# Patient Record
Sex: Male | Born: 1965 | Race: White | Hispanic: No | Marital: Married | Smoking: Never smoker
Health system: Southern US, Community
[De-identification: ages and names within clinical notes are randomized; demographics above are authoritative.]

## PROBLEM LIST (undated history)

## (undated) DIAGNOSIS — F319 Bipolar disorder, unspecified: Secondary | ICD-10-CM

## (undated) DIAGNOSIS — N19 Unspecified kidney failure: Secondary | ICD-10-CM

## (undated) DIAGNOSIS — E669 Obesity, unspecified: Secondary | ICD-10-CM

## (undated) DIAGNOSIS — F32A Depression, unspecified: Secondary | ICD-10-CM

## (undated) DIAGNOSIS — T8859XA Other complications of anesthesia, initial encounter: Secondary | ICD-10-CM

## (undated) DIAGNOSIS — F4310 Post-traumatic stress disorder, unspecified: Secondary | ICD-10-CM

## (undated) DIAGNOSIS — K635 Polyp of colon: Secondary | ICD-10-CM

## (undated) DIAGNOSIS — R079 Chest pain, unspecified: Secondary | ICD-10-CM

## (undated) DIAGNOSIS — G4733 Obstructive sleep apnea (adult) (pediatric): Secondary | ICD-10-CM

## (undated) HISTORY — PX: WRIST SURGERY: SHX841

## (undated) HISTORY — DX: Bipolar disorder, unspecified: F31.9

## (undated) HISTORY — DX: Post-traumatic stress disorder, unspecified: F43.10

## (undated) HISTORY — DX: Obesity, unspecified: E66.9

## (undated) HISTORY — DX: Polyp of colon: K63.5

## (undated) HISTORY — DX: Chest pain, unspecified: R07.9

## (undated) HISTORY — DX: Obstructive sleep apnea (adult) (pediatric): G47.33

## (undated) HISTORY — DX: Depression, unspecified: F32.A

---
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
## 2002-12-07 ENCOUNTER — Encounter: Payer: Self-pay | Admitting: Pulmonary Disease

## 2002-12-14 ENCOUNTER — Encounter: Payer: Self-pay | Admitting: Pulmonary Disease

## 2003-01-02 ENCOUNTER — Encounter: Payer: Self-pay | Admitting: Pulmonary Disease

## 2009-10-26 ENCOUNTER — Ambulatory Visit: Payer: Self-pay | Admitting: Pulmonary Disease

## 2009-10-26 DIAGNOSIS — G4733 Obstructive sleep apnea (adult) (pediatric): Secondary | ICD-10-CM

## 2009-10-26 HISTORY — DX: Obstructive sleep apnea (adult) (pediatric): G47.33

## 2009-11-01 ENCOUNTER — Encounter: Payer: Self-pay | Admitting: Pulmonary Disease

## 2009-11-01 ENCOUNTER — Ambulatory Visit (HOSPITAL_BASED_OUTPATIENT_CLINIC_OR_DEPARTMENT_OTHER): Admission: RE | Admit: 2009-11-01 | Discharge: 2009-11-01 | Payer: Self-pay | Admitting: Family Medicine

## 2009-11-16 ENCOUNTER — Ambulatory Visit: Payer: Self-pay | Admitting: Pulmonary Disease

## 2009-11-22 ENCOUNTER — Ambulatory Visit: Payer: Self-pay | Admitting: Pulmonary Disease

## 2010-01-03 ENCOUNTER — Telehealth (INDEPENDENT_AMBULATORY_CARE_PROVIDER_SITE_OTHER): Payer: Self-pay | Admitting: *Deleted

## 2010-04-17 ENCOUNTER — Encounter: Payer: Self-pay | Admitting: Pulmonary Disease

## 2010-09-24 NOTE — Letter (Signed)
Summary: [facility]  [facility]   Imported By: Lester Palmer 11/05/2009 10:32:30  _____________________________________________________________________  External Attachment:    Type:   Image     Comment:   External Document

## 2010-09-24 NOTE — Miscellaneous (Signed)
  Clinical Lists Changes  received note from dme the pt decided to stop cpap, and just work on weight loss. He has mild to moderate disease with only mild increased CV risk. will have nurse call pt to please call if he is not having success with weight loss.  Appended Document:  megan, please see this note.  Please call pt and let him know to call us back if he isn't making progress with weight loss.  We can discuss cpap, and other options for treatment.  Appended Document:  [id number]  Appended Document:  called and spoke wiht pt.  pt is aware to call our office to discuss other treatment options if he isn't sucessful with weight loss.

## 2010-09-24 NOTE — Progress Notes (Signed)
Summary: cpap  Phone Note Call from Patient   Caller: mindy-wife Call For: clance Summary of Call: [email or web address] wants [facility] to increase cpap pressure on husbands cpap machine Initial call taken by: Oneita Jolly,  Jan 03, 2010 8:57 AM  Follow-up for Phone Call        please get dme to put on auto mode for next 2 weeks to optimize his pressure for him, and let pt know that we will call him when data available. Follow-up by: Barbaraann Share MD,  Jan 03, 2010 4:34 PM  Additional Follow-up for Phone Call Additional follow up Details #1::        Aundra Millet will you put me an order in pcc box to put this pt on auto thanks Additional Follow-up by: Oneita Jolly,  Jan 04, 2010 9:40 AM    Additional Follow-up for Phone Call Additional follow up Details #2::    order in EMR.  Arman Filter LPN  Jan 04, 2010 10:33 AM

## 2010-09-24 NOTE — Assessment & Plan Note (Signed)
Summary: consult for osa.   Copy to:  Aleatha Borer Primary Provider/Referring Provider:  Aleatha Borer  CC:  Sleep Consult.  History of Present Illness: The pt is a 45y/o male who is referred today for possible osa.  He has been noted by his wife to have loud snoring, as well as pauses in his breathing during sleep.  His wife is a respiratory therapist, and has put a pulse oximeter on him during sleep which showed significant oxygen desaturation according to the patient.  He typically goes to bed btw 9-10pm, and arises at 4-6am to start his day.  He feels fairly rested upon arising, but does admit to mild sleep pressure at times during the day that he feels doesn't bother him.  He also notes some dozing in the evening watching tv.  He denies any issues with driving.  His weight is up 50 pounds over the last few years, and his epworth today is 9  Preventive Screening-Counseling & Management  Alcohol-Tobacco     Smoking Status: never  Current Medications (verified): 1)  Synthroid 88 Mcg Tabs (Levothyroxine Sodium) .... Take 1 Tablet By Mouth Once A Day 2)  Lithium Carbonate 300 Mg Caps (Lithium Carbonate) .... Take 3 Tabs By Mouth Daily 3)  Ritalin 20 Mg Tabs (Methylphenidate Hcl) .... Take 3 Tabs By Mouth Daily 4)  Bupropion Hcl 200 Mg Xr12h-Tab (Bupropion Hcl) .... Take 1 Tablet By Mouth Two Times A Day  Allergies (verified): No Known Drug Allergies  Past History:  Past Medical History: bipolar disease  Past Surgical History: none per pt report.   Family History: Reviewed history and no changes required. none per pt report.   Social History: Reviewed history and no changes required. Patient never smoked.  pt is married. pt has children. pt is a superintendant.Smoking Status:  never  Review of Systems       The patient complains of productive cough, weight change, and depression.  The patient denies shortness of breath with activity, shortness of breath at rest,  non-productive cough, coughing up blood, chest pain, irregular heartbeats, acid heartburn, indigestion, loss of appetite, abdominal pain, difficulty swallowing, sore throat, tooth/dental problems, headaches, nasal congestion/difficulty breathing through nose, sneezing, itching, ear ache, anxiety, hand/feet swelling, joint stiffness or pain, rash, change in color of mucus, and fever.    Vital Signs:  Patient profile:   45 year old male Height:      67 inches Weight:      247 pounds BMI:     38.83 O2 Sat:      96 % on Room air Temp:     98.3 degrees F oral Pulse rate:   94 / minute BP sitting:   188 / 80  (left arm) Cuff size:   regular  Vitals Entered By: Arman Filter LPN (October 26, 452 11:40 AM)  O2 Flow:  Room air CC: Sleep Consult Comments Medications reviewed with patient Arman Filter LPN  October 27, 979 11:40 AM    Physical Exam  General:  overweight male in nad  Eyes:  PERRLA and EOMI.   Nose:  mild septal deviation to left. Mouth:  moderate elongation of soft palate, normal uvula Neck:  no jvd, tmg, LN Lungs:  clear to auscultation Heart:  rrr, no mrg Abdomen:  soft and nontender, bs+ Extremities:  no edema noted, pulses intact distally. Neurologic:  alert and oriented, moves all 4.   Impression & Recommendations:  Problem # 1:  OBSTRUCTIVE SLEEP APNEA (  ICD-327.23) the pt's history is very suggestive of osa.  He has gained a considerable amount of weight, his wife has noted an abnormal breathing pattern during sleep, he has documented nocturnal desaturation, and he is clearly sleepier than what he believes.  I have had a long discussion with him about sleep apnea, including its impact on his QOL and CV health.  I think he needs a sleep study for diagnosis, and the pt is agreeable.  Medications Added to Medication List This Visit: 1)  Ritalin 20 Mg Tabs (Methylphenidate hcl) .... Take 3 tabs by mouth daily 2)  Bupropion Hcl 200 Mg Xr12h-tab (Bupropion hcl) ....  Take 1 tablet by mouth two times a day  Other Orders: Consultation Level IV ([address]) Sleep Disorder Referral (Sleep Disorder)  Patient Instructions: 1)  will schedule for sleep study 2)  work on weight loss 3)  will arrange for f/u once results are available.  Appended Document: consult for osa. received sleep study from [facility].  Put in your very important look at folder.

## 2010-09-24 NOTE — Letter (Signed)
Summary: [facility]  [facility]   Imported By: Lester [place] 11/05/2009 10:30:12  _____________________________________________________________________  External Attachment:    Type:   Image     Comment:   External Document

## 2010-09-24 NOTE — Assessment & Plan Note (Signed)
Summary: rov to discuss sleep study results.   Copy to:  Aleatha Borer Primary Provider/Referring Provider:  Aleatha Borer  CC:  Follow up to discuss sleep study results.  No complaints..  History of Present Illness: the pt comes in today for f/u of his recent sleep study.  He was found to have moderate osa with AHI 20/hr and desat to 76%.  I have gone over his study with him in detail, and answered all of his questions.  Current Medications (verified): 1)  Synthroid 88 Mcg Tabs (Levothyroxine Sodium) .... Take 1 Tablet By Mouth Once A Day 2)  Lithium Carbonate 300 Mg Caps (Lithium Carbonate) .... Take 3 Tabs By Mouth Daily 3)  Ritalin 20 Mg Tabs (Methylphenidate Hcl) .... Take 3 Tabs By Mouth Daily 4)  Bupropion Hcl 200 Mg Xr12h-Tab (Bupropion Hcl) .... Take 1 Tablet By Mouth Two Times A Day  Allergies (verified): No Known Drug Allergies  Review of Systems      See HPI  Vital Signs:  Patient profile:   45 year old male Height:      67 inches Weight:      249.50 pounds BMI:     39.22 O2 Sat:      97 % on Room air Temp:     98.0 degrees F oral Pulse rate:   86 / minute BP sitting:   114 / 84  (right arm) Cuff size:   large  Vitals Entered By: Gweneth Dimitri RN (November 16, 2009 9:05 AM)  O2 Flow:  Room air CC: Follow up to discuss sleep study results.  No complaints. Comments Medications reviewed with patient Daytime contact number verified with patient. 35    Physical Exam  General:  ow male in nad   Impression & Recommendations:  Problem # 1:  OBSTRUCTIVE SLEEP APNEA (ICD-327.23) the pt has moderate osa by his recent sleep study, and does have some sleep disruption and daytime symptoms.  I have discussed with him the various treatment options, including a trial of weight loss, surgery, dental appliance, and cpap.  After a long discussion, he has decided to try cpap.  Will start on a moderate pressure level to allow for desensitization, and will later optimize his  pressure.  I have encouraged him to work on weight loss.  Time spent with pt today discussing the above was .  Other Orders: Est. Patient Level III (78469) DME Referral (DME)  Patient Instructions: 1)  will start on cpap.  Please call if tolerance issues 2)  work on weight loss 3)  followup with me in 4weeks.

## 2011-05-20 ENCOUNTER — Other Ambulatory Visit (HOSPITAL_COMMUNITY): Payer: Self-pay | Admitting: Family Medicine

## 2011-05-20 DIAGNOSIS — R079 Chest pain, unspecified: Secondary | ICD-10-CM

## 2011-05-22 ENCOUNTER — Encounter (HOSPITAL_COMMUNITY): Payer: Self-pay | Admitting: Radiology

## 2011-05-29 ENCOUNTER — Ambulatory Visit (HOSPITAL_COMMUNITY): Payer: BC Managed Care – PPO | Attending: Family Medicine | Admitting: Radiology

## 2011-05-29 VITALS — Ht 67.0 in | Wt 244.0 lb

## 2011-05-29 DIAGNOSIS — R079 Chest pain, unspecified: Secondary | ICD-10-CM

## 2011-05-29 DIAGNOSIS — R0989 Other specified symptoms and signs involving the circulatory and respiratory systems: Secondary | ICD-10-CM

## 2011-05-29 DIAGNOSIS — R0609 Other forms of dyspnea: Secondary | ICD-10-CM

## 2011-05-29 DIAGNOSIS — R0602 Shortness of breath: Secondary | ICD-10-CM

## 2011-05-29 MED ORDER — TECHNETIUM TC 99M TETROFOSMIN IV KIT
11.0000 | PACK | Freq: Once | INTRAVENOUS | Status: AC | PRN
  Administered 2011-05-29: 11 via INTRAVENOUS

## 2011-05-29 MED ORDER — TECHNETIUM TC 99M TETROFOSMIN IV KIT
33.0000 | PACK | Freq: Once | INTRAVENOUS | Status: AC | PRN
  Administered 2011-05-29: 33 via INTRAVENOUS

## 2011-05-29 NOTE — Progress Notes (Signed)
[facility] SITE 3 NUCLEAR MED [address] [place] [address] [phone]  Cardiology Nuclear Med Study  Casey Yoder is a 45 y.o. male [id number] 09/17/1965   Nuclear Med Background Indication for Stress Test:  Evaluation for Ischemia History:  No previous documented CAD Cardiac Risk Factors: Family History - CAD  Symptoms:  Chest Pain, DOE and SOB   Nuclear Pre-Procedure Caffeine/Decaff Intake:  7:30am NPO After: 7:30am   Lungs:  clear IV 0.9% NS with Angio Cath:  20g  IV Site: R Antecubital  IV Started by:  Stanton Kidney, EMT-P  Chest Size (in):  48 Cup Size: n/a  Height: 5\' 7"  (1.702 m)  Weight:  244 lb (110.678 kg)  BMI:  Body mass index is 38.22 kg/(m^2). Tech Comments:  NA    Nuclear Med Study 1 or 2 day study: 1 day  Stress Test Type:  Stress  Reading MD: Olga Millers, MD  Order Authorizing Provider:  J.Manning  Resting Radionuclide: Technetium 3m Tetrofosmin  Resting Radionuclide Dose: 11 mCi   Stress Radionuclide:  Technetium 51m Tetrofosmin  Stress Radionuclide Dose: 33 mCi           Stress Protocol Rest HR: 52 Stress HR: 151  Rest BP: 110/74 Stress BP: 144/65  Exercise Time (min): 9:15 METS: 10.50   Predicted Max HR: 176 bpm % Max HR: 85.8 bpm Rate Pressure Product: 21308   Dose of Adenosine (mg):  n/a Dose of Lexiscan: n/a mg  Dose of Atropine (mg): n/a Dose of Dobutamine: n/a mcg/kg/min (at max HR)  Stress Test Technologist: Milana Na, EMT-P  Nuclear Technologist:  Domenic Polite, CNMT     Rest Procedure:  Myocardial perfusion imaging was performed at rest 45 minutes following the intravenous administration of Technetium 73m Tetrofosmin. Rest ECG: Sinus Bradycardia  Stress Procedure:  The patient exercised for 9:15.  The patient stopped due to fatigue and denied any chest pain.  There were no significant ST-T wave changes and rare pvc. BP's fairly Blunted throughout. He did experience some (R) arm and hand  numbness.   Technetium 67m Tetrofosmin was injected at peak exercise and myocardial perfusion imaging was performed after a brief delay. Stress ECG: No significant ST segment change suggestive of ischemia.  QPS Raw Data Images:  Acquisition technically good; normal left ventricular size. Stress Images:  Normal homogeneous uptake in all areas of the myocardium. Rest Images:  Normal homogeneous uptake in all areas of the myocardium. Subtraction (SDS):  No evidence of ischemia. Transient Ischemic Dilatation (Normal <1.22):  .90 Lung/Heart Ratio (Normal <0.45):  .39  Quantitative Gated Spect Images QGS EDV:  102 ml QGS ESV:  38 ml QGS cine images:  NL LV Function; NL Wall Motion QGS EF: 63%  Impression Exercise Capacity:  Good exercise capacity. BP Response:  Normal blood pressure response. Clinical Symptoms:  No chest pain. ECG Impression:  No significant ST segment change suggestive of ischemia. Comparison with Prior Nuclear Study: No images to compare  Overall Impression:  Normal stress nuclear study with no ischemia or infarction.  Olga Millers

## 2011-05-30 ENCOUNTER — Encounter (HOSPITAL_COMMUNITY): Payer: Self-pay | Admitting: Psychiatry

## 2011-05-30 NOTE — Progress Notes (Signed)
Copy of Nuclear Report Faxed to Dr. Kathrynn Running @ [phone].[place]

## 2012-12-24 ENCOUNTER — Encounter: Payer: Self-pay | Admitting: *Deleted

## 2012-12-24 DIAGNOSIS — E669 Obesity, unspecified: Secondary | ICD-10-CM | POA: Insufficient documentation

## 2012-12-24 DIAGNOSIS — F319 Bipolar disorder, unspecified: Secondary | ICD-10-CM | POA: Insufficient documentation

## 2012-12-24 DIAGNOSIS — R079 Chest pain, unspecified: Secondary | ICD-10-CM | POA: Insufficient documentation

## 2012-12-30 ENCOUNTER — Institutional Professional Consult (permissible substitution): Payer: BC Managed Care – PPO | Admitting: Cardiovascular Disease

## 2012-12-30 ENCOUNTER — Encounter: Payer: Self-pay | Admitting: *Deleted

## 2017-02-12 ENCOUNTER — Inpatient Hospital Stay (HOSPITAL_COMMUNITY)
Admission: EM | Admit: 2017-02-12 | Discharge: 2017-02-14 | DRG: 867 | Disposition: A | Discharge status: Home / Self Care | Payer: BC Managed Care – PPO | Attending: Internal Medicine | Admitting: Internal Medicine

## 2017-02-12 ENCOUNTER — Emergency Department (HOSPITAL_COMMUNITY): Payer: BC Managed Care – PPO

## 2017-02-12 DIAGNOSIS — G9341 Metabolic encephalopathy: Secondary | ICD-10-CM | POA: Diagnosis present

## 2017-02-12 DIAGNOSIS — G4733 Obstructive sleep apnea (adult) (pediatric): Secondary | ICD-10-CM | POA: Diagnosis present

## 2017-02-12 DIAGNOSIS — E669 Obesity, unspecified: Secondary | ICD-10-CM | POA: Diagnosis present

## 2017-02-12 DIAGNOSIS — Z6835 Body mass index (BMI) 35.0-35.9, adult: Secondary | ICD-10-CM

## 2017-02-12 DIAGNOSIS — B999 Unspecified infectious disease: Secondary | ICD-10-CM | POA: Diagnosis present

## 2017-02-12 DIAGNOSIS — Z6837 Body mass index (BMI) 37.0-37.9, adult: Secondary | ICD-10-CM

## 2017-02-12 DIAGNOSIS — G9340 Encephalopathy, unspecified: Secondary | ICD-10-CM | POA: Diagnosis not present

## 2017-02-12 DIAGNOSIS — Z8249 Family history of ischemic heart disease and other diseases of the circulatory system: Secondary | ICD-10-CM | POA: Diagnosis not present

## 2017-02-12 DIAGNOSIS — W57XXXA Bitten or stung by nonvenomous insect and other nonvenomous arthropods, initial encounter: Secondary | ICD-10-CM | POA: Diagnosis present

## 2017-02-12 DIAGNOSIS — H53143 Visual discomfort, bilateral: Secondary | ICD-10-CM | POA: Diagnosis present

## 2017-02-12 DIAGNOSIS — R350 Frequency of micturition: Secondary | ICD-10-CM | POA: Diagnosis present

## 2017-02-12 DIAGNOSIS — R358 Other polyuria: Secondary | ICD-10-CM

## 2017-02-12 DIAGNOSIS — R4182 Altered mental status, unspecified: Secondary | ICD-10-CM

## 2017-02-12 DIAGNOSIS — R509 Fever, unspecified: Secondary | ICD-10-CM | POA: Diagnosis not present

## 2017-02-12 DIAGNOSIS — R410 Disorientation, unspecified: Secondary | ICD-10-CM

## 2017-02-12 DIAGNOSIS — R3589 Other polyuria: Secondary | ICD-10-CM | POA: Diagnosis present

## 2017-02-12 DIAGNOSIS — I951 Orthostatic hypotension: Secondary | ICD-10-CM | POA: Diagnosis present

## 2017-02-12 DIAGNOSIS — I493 Ventricular premature depolarization: Secondary | ICD-10-CM | POA: Diagnosis present

## 2017-02-12 DIAGNOSIS — F319 Bipolar disorder, unspecified: Secondary | ICD-10-CM | POA: Diagnosis present

## 2017-02-12 LAB — COMPREHENSIVE METABOLIC PANEL
ALT: 15 U/L — ABNORMAL LOW (ref 17–63)
ANION GAP: 9 (ref 5–15)
AST: 30 U/L (ref 15–41)
Albumin: 4.1 g/dL (ref 3.5–5.0)
Alkaline Phosphatase: 62 U/L (ref 38–126)
BUN: 13 mg/dL (ref 6–20)
CHLORIDE: 102 mmol/L (ref 101–111)
CO2: 24 mmol/L (ref 22–32)
Calcium: 9.1 mg/dL (ref 8.9–10.3)
Creatinine, Ser: 1.18 mg/dL (ref 0.61–1.24)
GFR calc non Af Amer: >60 mL/min (ref >60)
Glucose, Bld: 96 mg/dL (ref 65–99)
POTASSIUM: 4.6 mmol/L (ref 3.5–5.1)
Sodium: 135 mmol/L (ref 135–145)
Total Bilirubin: 1.3 mg/dL — ABNORMAL HIGH (ref 0.3–1.2)
Total Protein: 6.4 g/dL — ABNORMAL LOW (ref 6.5–8.1)

## 2017-02-12 LAB — CBC WITH DIFFERENTIAL/PLATELET
BASOS ABS: 0 10*3/uL (ref 0.0–0.1)
Basophils Relative: 0 %
EOS ABS: 0.1 10*3/uL (ref 0.0–0.7)
EOS PCT: 1 %
HCT: 43.8 % (ref 39.0–52.0)
Hemoglobin: 14.9 g/dL (ref 13.0–17.0)
LYMPHS ABS: 1.3 10*3/uL (ref 0.7–4.0)
Lymphocytes Relative: 14 %
MCH: 31.7 pg (ref 26.0–34.0)
MCHC: 34 g/dL (ref 30.0–36.0)
MCV: 93.2 fL (ref 78.0–100.0)
Monocytes Absolute: 0.6 10*3/uL (ref 0.1–1.0)
Monocytes Relative: 6 %
Neutro Abs: 7.4 10*3/uL (ref 1.7–7.7)
Neutrophils Relative %: 79 %
PLATELETS: 187 10*3/uL (ref 150–400)
RBC: 4.7 MIL/uL (ref 4.22–5.81)
RDW: 12.3 % (ref 11.5–15.5)
WBC: 9.3 10*3/uL (ref 4.0–10.5)

## 2017-02-12 LAB — RAPID HIV SCREEN (HIV 1/2 AB+AG)
HIV 1/2 Antibodies: NONREACTIVE
HIV-1 P24 Antigen - HIV24: NONREACTIVE

## 2017-02-12 LAB — BASIC METABOLIC PANEL
Anion gap: 6 (ref 5–15)
BUN: 13 mg/dL (ref 6–20)
CALCIUM: 9.1 mg/dL (ref 8.9–10.3)
CHLORIDE: 107 mmol/L (ref 101–111)
CO2: 25 mmol/L (ref 22–32)
CREATININE: 1.14 mg/dL (ref 0.61–1.24)
GFR calc Af Amer: >60 mL/min (ref >60)
GFR calc non Af Amer: >60 mL/min (ref >60)
GLUCOSE: 95 mg/dL (ref 65–99)
Potassium: 4 mmol/L (ref 3.5–5.1)
Sodium: 138 mmol/L (ref 135–145)

## 2017-02-12 LAB — CSF CELL COUNT WITH DIFFERENTIAL
RBC COUNT CSF: 391 /mm3 — AB
TUBE #: 1
WBC, CSF: 2 /mm3 (ref 0–5)

## 2017-02-12 LAB — RAPID URINE DRUG SCREEN, HOSP PERFORMED
AMPHETAMINES: NOT DETECTED
Barbiturates: NOT DETECTED
Benzodiazepines: NOT DETECTED
Cocaine: NOT DETECTED
OPIATES: NOT DETECTED
Tetrahydrocannabinol: NOT DETECTED

## 2017-02-12 LAB — LACTIC ACID, PLASMA
LACTIC ACID, VENOUS: 0.7 mmol/L (ref 0.5–1.9)
Lactic Acid, Venous: 0.9 mmol/L (ref 0.5–1.9)

## 2017-02-12 LAB — URINALYSIS, ROUTINE W REFLEX MICROSCOPIC
BILIRUBIN URINE: NEGATIVE
Glucose, UA: NEGATIVE mg/dL
Hgb urine dipstick: NEGATIVE
Ketones, ur: NEGATIVE mg/dL
Leukocytes, UA: NEGATIVE
NITRITE: NEGATIVE
PH: 7 (ref 5.0–8.0)
Protein, ur: NEGATIVE mg/dL
SPECIFIC GRAVITY, URINE: 1.002 — AB (ref 1.005–1.030)

## 2017-02-12 LAB — TSH: TSH: 3.542 u[IU]/mL (ref 0.350–4.500)

## 2017-02-12 LAB — AMMONIA: AMMONIA: 26 umol/L (ref 9–35)

## 2017-02-12 LAB — I-STAT CG4 LACTIC ACID, ED
Lactic Acid, Venous: 1.27 mmol/L (ref 0.5–1.9)
Lactic Acid, Venous: 0.81 mmol/L (ref 0.5–1.9)

## 2017-02-12 LAB — ETHANOL

## 2017-02-12 LAB — CK: CK TOTAL: 111 U/L (ref 49–397)

## 2017-02-12 LAB — CRYPTOCOCCAL ANTIGEN, CSF: CRYPTO AG: NEGATIVE

## 2017-02-12 LAB — GLUCOSE, CSF: Glucose, CSF: 58 mg/dL (ref 40–70)

## 2017-02-12 LAB — I-STAT TROPONIN, ED: TROPONIN I, POC: 0 ng/mL (ref 0.00–0.08)

## 2017-02-12 LAB — TROPONIN I

## 2017-02-12 LAB — PROTEIN, CSF: TOTAL PROTEIN, CSF: 34 mg/dL (ref 15–45)

## 2017-02-12 MED ORDER — ONDANSETRON HCL 4 MG PO TABS: 4.0000 mg | ORAL_TABLET | Freq: Four times a day (QID) | ORAL | Status: DC | PRN

## 2017-02-12 MED ORDER — DIPHENHYDRAMINE HCL 50 MG/ML IJ SOLN
25.0000 mg | Freq: Once | INTRAMUSCULAR | Status: AC
  Administered 2017-02-12: 25 mg via INTRAVENOUS
  Filled 2017-02-12: qty 1

## 2017-02-12 MED ORDER — LORAZEPAM 2 MG/ML IJ SOLN
1.0000 mg | Freq: Once | INTRAMUSCULAR | Status: AC
  Administered 2017-02-12: 1 mg via INTRAVENOUS
  Filled 2017-02-12: qty 1

## 2017-02-12 MED ORDER — ONDANSETRON HCL 4 MG/2ML IJ SOLN: 4.0000 mg | Freq: Four times a day (QID) | INTRAMUSCULAR | Status: DC | PRN

## 2017-02-12 MED ORDER — MIDAZOLAM HCL 2 MG/2ML IJ SOLN
1.0000 mg | Freq: Once | INTRAMUSCULAR | Status: AC
  Administered 2017-02-12: 1 mg via INTRAVENOUS

## 2017-02-12 MED ORDER — ACETAMINOPHEN 650 MG RE SUPP: 650.0000 mg | Freq: Four times a day (QID) | RECTAL | Status: DC | PRN

## 2017-02-12 MED ORDER — SODIUM CHLORIDE 0.9 % IV BOLUS (SEPSIS)
1000.0000 mL | Freq: Once | INTRAVENOUS | Status: AC
  Administered 2017-02-12: 1000 mL via INTRAVENOUS

## 2017-02-12 MED ORDER — ACETAMINOPHEN 325 MG PO TABS
650.0000 mg | ORAL_TABLET | Freq: Four times a day (QID) | ORAL | Status: DC | PRN
  Administered 2017-02-13 – 2017-02-14 (×2): 650 mg via ORAL
  Filled 2017-02-12 (×2): qty 2

## 2017-02-12 MED ORDER — LORAZEPAM 2 MG/ML IJ SOLN: 0.5000 mg | INTRAMUSCULAR | Status: DC | PRN

## 2017-02-12 MED ORDER — ENOXAPARIN SODIUM 40 MG/0.4ML ~~LOC~~ SOLN
40.0000 mg | SUBCUTANEOUS | Status: DC
  Administered 2017-02-13 – 2017-02-14 (×2): 40 mg via SUBCUTANEOUS
  Filled 2017-02-12 (×2): qty 0.4

## 2017-02-12 MED ORDER — LIDOCAINE HCL 1 % IJ SOLN
INTRAMUSCULAR | Status: AC
  Filled 2017-02-12: qty 20

## 2017-02-12 MED ORDER — MIDAZOLAM HCL 2 MG/2ML IJ SOLN
INTRAMUSCULAR | Status: AC
  Administered 2017-02-12: 1 mg
  Filled 2017-02-12: qty 2

## 2017-02-12 MED ORDER — ACETAMINOPHEN 500 MG PO TABS
1000.0000 mg | ORAL_TABLET | Freq: Once | ORAL | Status: AC
  Administered 2017-02-12: 1000 mg via ORAL
  Filled 2017-02-12: qty 2

## 2017-02-12 MED ORDER — SODIUM CHLORIDE 0.9 % IV SOLN
INTRAVENOUS | Status: DC
  Administered 2017-02-12 – 2017-02-14 (×3): via INTRAVENOUS

## 2017-02-12 MED ORDER — DOXYCYCLINE HYCLATE 100 MG IV SOLR
100.0000 mg | Freq: Two times a day (BID) | INTRAVENOUS | Status: DC
  Administered 2017-02-12 – 2017-02-14 (×4): 100 mg via INTRAVENOUS
  Filled 2017-02-12 (×4): qty 100

## 2017-02-12 MED ORDER — ALBUTEROL SULFATE (2.5 MG/3ML) 0.083% IN NEBU: 2.5000 mg | INHALATION_SOLUTION | RESPIRATORY_TRACT | Status: DC | PRN

## 2017-02-12 NOTE — ED Notes (Signed)
Pt unable to sit still during LP procedure, Dr Patria Maneampos gave verbal order for 1mg  versed.

## 2017-02-12 NOTE — ED Notes (Signed)
After multiple attempts by Dr Patria Maneampos, unable to complete Lumbar Puncture. Dr Patria Maneampos to order Radiology guided Lumbar puncture

## 2017-02-12 NOTE — ED Notes (Signed)
Pt to xray

## 2017-02-12 NOTE — ED Provider Notes (Signed)
[facility]-EMERGENCY DEPT Provider Note   CSN: [id number] Arrival date & time: 02/12/17  1327     History   Chief Complaint Chief Complaint  Patient presents with  . Altered Mental Status    HPI Casey Yoder is a 51 y.o. male who presents with confusion.  He woke up this morning feeling fine and went to work at [employer].  He drove to work, went to his truck and went to sleep as he was not feeling well.  He called his wife, a RT, and drove back to [place] and his wife drove him to urgent care.  At urgent care around 11:45am he became confused, agitated and was transferred to the ED by EMS.  With EMS he was febrile to 102.1 tympanic with a CBG of 91.  His wife reports that he is confused, asking the same questions multiple times, has "lost his filter," is agitated and is not acting like him self.   She reports that he does not use drugs or alcohol.  He has not had any trauma recently.    Patient reports that he feels like a house is on his entire body including his head, chest, and extremities.    According to his wife he has urinated 9 times before coming to the ED today which is abnormal for him.    Of note over the past few weeks he has had multiple tick bites, including one to his left shin and one to his right shoulder.  He denies any rashes.  Has been feeling ok until today.   HPI  Past Medical History:  Diagnosis Date  . Bipolar 1 disorder   . Chest pain   . Obesity   . OBSTRUCTIVE SLEEP APNEA 10/26/2009   Qualifier: Diagnosis of  By: Shelle Iron MD, Maree Krabbe     Patient Active Problem List   Diagnosis Date Noted  . Chest pain   . Obesity   . Bipolar 1 disorder (HCC)   . OBSTRUCTIVE SLEEP APNEA 10/26/2009    Past Surgical History:  Procedure Laterality Date  . KNEE SURGERY  86       Home Medications    Prior to Admission medications   Medication Sig Start Date End Date Taking? Authorizing Provider  MAGNESIUM PO Take 1 tablet by mouth every evening.   Yes [provider]  naproxen sodium (ANAPROX) 220 MG tablet Take 220 mg by mouth 2 (two) times daily as needed (pain).   Yes [provider]  POTASSIUM PO Take 1 tablet by mouth daily.   Yes [provider]    Family History Family History  Problem Relation Age of Onset  . Heart disease Maternal Grandfather 63    Social History Social History  Substance Use Topics  . Smoking status: Not on file  . Smokeless tobacco: Not on file  . Alcohol use Not on file     Allergies   Patient has no known allergies.   Review of Systems Review of Systems  Constitutional: Positive for chills, fatigue and fever.  HENT: Negative for congestion, ear pain, facial swelling, rhinorrhea, sinus pressure, sneezing and sore throat.   Eyes: Positive for photophobia. Negative for pain and visual disturbance.  Respiratory: Positive for chest tightness (Intermittent). Negative for cough and shortness of breath.   Cardiovascular: Positive for chest pain. Negative for palpitations and leg swelling.  Gastrointestinal: Positive for nausea. Negative for abdominal pain, constipation, diarrhea and vomiting.  Endocrine: Positive for polyuria. Negative for polydipsia and  polyphagia.  Genitourinary: Positive for frequency (Urinated 9 times today PTA). Negative for decreased urine volume, difficulty urinating, dysuria and urgency.  Musculoskeletal: Positive for arthralgias, back pain and myalgias. Negative for neck pain and neck stiffness.  Skin: Positive for wound. Negative for color change and rash.  Neurological: Positive for headaches. Negative for syncope, facial asymmetry, speech difficulty, light-headedness and numbness.  Psychiatric/Behavioral: Positive for agitation and confusion. The patient is nervous/anxious.      Physical Exam Updated Vital Signs BP 124/85 (BP Location: Right Arm)   Pulse 86   Temp 98.4 F (36.9 C) (Oral)   Resp 14   SpO2 97%   Physical Exam  Constitutional: He is  oriented to person, place, and time. He appears well-developed and well-nourished.  HENT:  Head: Normocephalic and atraumatic.  Right Ear: External ear normal.  Left Ear: External ear normal.  Nose: Nose normal.  Mouth/Throat: Oropharynx is clear and moist. No oropharyngeal exudate.  Eyes: Conjunctivae and EOM are normal. Pupils are equal, round, and reactive to light. No scleral icterus.  Neck: Normal range of motion. Neck supple. No JVD present.  Cardiovascular: Normal rate, regular rhythm, normal heart sounds and intact distal pulses.   No murmur heard. Pulmonary/Chest: Effort normal and breath sounds normal. No respiratory distress. He has no wheezes.  Abdominal: Soft. Bowel sounds are normal. He exhibits no distension. There is no tenderness. There is no guarding.  Musculoskeletal:  Neck is soft, supple, non tender, full AROM with out pain.   Back, arms and legs are soft compartments, non tender.   Right wrist has brace intact, hand is warm, well perfused.   Lymphadenopathy:    He has no cervical adenopathy.  Neurological: He is alert and oriented to person, place, and time. He has normal strength. He displays no tremor. No cranial nerve deficit or sensory deficit. He exhibits normal muscle tone. Coordination normal. GCS eye subscore is 4. GCS verbal subscore is 5. GCS motor subscore is 6.  Mental Status:  Alert, oriented x3, intermittently agitated with repetitive questioning.   Able to give a coherent history. Speech fluent without evidence of aphasia. Able to follow 2 step commands without difficulty.  Cranial Nerves:  II:  Peripheral visual fields grossly normal, pupils equal, round, reactive to light III,IV, VI: ptosis not present, extra-ocular motions intact bilaterally  V,VII: smile symmetric, facial light touch sensation equal VIII: hearing grossly normal to voice  X: uvula elevates symmetrically  XI: bilateral shoulder shrug symmetric and strong XII: midline tongue  extension without fassiculations Motor:  Normal tone. 5/5 in upper and lower extremities bilaterally including strong and equal grip strength and dorsiflexion/plantar flexion Cerebellar: normal finger-to-nose with bilateral upper extremities Gait: normal gait  CV: distal pulses palpable throughout    Skin: Skin is warm and dry. No rash noted.  No rashes or purpura.  Small well healing scabs on left thigh and right shoulder where reported tick bites occurred.  No obvious infections or drainage.  Psychiatric: His affect is labile and inappropriate. His speech is tangential. He is agitated. He exhibits abnormal recent memory. He is inattentive.  Nursing note and vitals reviewed.    ED Treatments / Results  Labs (all labs ordered are listed, but only abnormal results are displayed) Labs Reviewed  COMPREHENSIVE METABOLIC PANEL - Abnormal; Notable for the following:       Result Value   Total Protein 6.4 (*)    ALT 15 (*)    Total Bilirubin 1.3 (*)  All other components within normal limits  URINALYSIS, ROUTINE W REFLEX MICROSCOPIC - Abnormal; Notable for the following:    Color, Urine COLORLESS (*)    Specific Gravity, Urine 1.002 (*)    All other components within normal limits  CSF CELL COUNT WITH DIFFERENTIAL - Abnormal; Notable for the following:    RBC Count, CSF 391 (*)    All other components within normal limits  COMPREHENSIVE METABOLIC PANEL - Abnormal; Notable for the following:    Glucose, Bld 115 (*)    Calcium 8.8 (*)    Total Protein 6.3 (*)    All other components within normal limits  CULTURE, BLOOD (ROUTINE X 2)  CULTURE, BLOOD (ROUTINE X 2)  CSF CULTURE  ANAEROBIC CULTURE  ETHANOL  LACTIC ACID, PLASMA  LACTIC ACID, PLASMA  TROPONIN I  CBC WITH DIFFERENTIAL/PLATELET  RAPID URINE DRUG SCREEN, HOSP PERFORMED  AMMONIA  B. BURGDORFI ANTIBODIES  CK  BASIC METABOLIC PANEL  RAPID HIV SCREEN (HIV 1/2 AB+AG)  RPR  CRYPTOCOCCAL ANTIGEN, CSF  VDRL, CSF    GLUCOSE, CSF  PROTEIN, CSF  RPR  HERPES SIMPLEX VIRUS(HSV) DNA BY PCR  TSH  CBC  MAGNESIUM  ROCKY MTN SPOTTED FVR ABS PNL(IGG+IGM)  EHRLICHIA ANTIBODY PANEL  ARBOVIRUS IGG, CSF  ENTEROVIRUS PCR  ARBOVIRUS PANEL, [facility]  B. BURGDORFI ANTIBODIES, CSF  B. BURGDORFI ANTIBODIES  ROCKY MTN SPOTTED FVR ABS PNL(IGG+IGM)  I-STAT CG4 LACTIC ACID, ED  I-STAT TROPOININ, ED  I-STAT CG4 LACTIC ACID, ED    EKG  EKG Interpretation  Date/Time:  Thursday February 12 2017 14:10:41 EDT Ventricular Rate:  98 PR Interval:    QRS Duration: 79 QT Interval:  328 QTC Calculation: 419 R Axis:   56 Text Interpretation:  Sinus tachycardia Multiple ventricular premature complexes Confirmed by Cathren Laine ([id number]) on 02/12/2017 2:22:16 PM       Radiology Dg Chest 2 View  Result Date: 02/12/2017 CLINICAL DATA:  Confusion, febrile EXAM: CHEST  2 VIEW COMPARISON:  None. FINDINGS: No active infiltrate or effusion is seen. Mediastinal and hilar contours are unremarkable. The heart is within upper limits of normal. No bony abnormality is seen. IMPRESSION: No active cardiopulmonary disease. Electronically Signed   By: Dwyane Dee M.D.   On: 02/12/2017 15:07   Ct Head Wo Contrast  Result Date: 02/12/2017 CLINICAL DATA:  Confusion EXAM: CT HEAD WITHOUT CONTRAST TECHNIQUE: Contiguous axial images were obtained from the base of the skull through the vertex without intravenous contrast. COMPARISON:  None FINDINGS: Brain: No evidence of acute infarction, hemorrhage, hydrocephalus, extra-axial collection or mass lesion/mass effect. Vascular: No hyperdense vessel or unexpected calcification. Skull: No osseous abnormality. Sinuses/Orbits: Visualized paranasal sinuses are clear. Visualized mastoid sinuses are clear. Visualized orbits demonstrate no focal abnormality. Other: None IMPRESSION: No acute intracranial pathology. Electronically Signed   By: Elige Ko   On: 02/12/2017 15:51   Dg Fluoro Guide Lumbar  Puncture  Result Date: 02/12/2017 CLINICAL DATA:  Acute onset of confusion, orthostatic hypotension and fever. Nausea and increased urinary frequency. Initial encounter. EXAM: DIAGNOSTIC LUMBAR PUNCTURE UNDER FLUOROSCOPIC GUIDANCE FLUOROSCOPY TIME:  Fluoroscopy Time:  12 seconds Number of Acquired Spot Images: 0 PROCEDURE: Informed consent was obtained from the patient's wife prior to the procedure, including potential complications of headache, allergy, and pain. With the patient prone, the lower back was prepped with Betadine. 1% Lidocaine was used for local anesthesia. Lumbar puncture was performed at the L4-L5 level using a 20 gauge needle with return of  clear CSF. 10 mL of CSF were obtained for laboratory studies. The patient tolerated the procedure well and there were no apparent complications. IMPRESSION: Successful lumbar puncture, with return of clear CSF. Electronically Signed   By: Roanna RaiderJeffery  Chang M.D.   On: 02/12/2017 21:36    Procedures Procedures (including critical care time)  Medications Ordered in ED Medications  midazolam (VERSED) 2 MG/2ML injection (not administered)  sodium chloride 0.9 % bolus 1,000 mL (0 mLs Intravenous Stopped 02/12/17 1443)  acetaminophen (TYLENOL) tablet 1,000 mg (1,000 mg Oral Given 02/12/17 1619)  LORazepam (ATIVAN) injection 1 mg (1 mg Intravenous Given 02/12/17 1726)  diphenhydrAMINE (BENADRYL) injection 25 mg (25 mg Intravenous Given 02/12/17 1726)     Initial Impression / Assessment and Plan / ED Course  I have reviewed the triage vital signs and the nursing notes.  Pertinent labs & imaging results that were available during my care of the patient were reviewed by me and considered in my medical decision making (see chart for details).  Clinical Course as of Feb 13 2114  Thu Feb 12, 2017  1503 Wife reports patient has urinated 4 times now since arrival.   [EH]  1617 Spoke with ID Dr. Drue SecondSnider who suggests suggest repeat BMP, LP with opening pressure,  aerobic, CSF titers for RMSF, VDRL, HSV, Arbovirus and enterovirus panels along with basic cell count, protein, and glucose. If LP with lymphocytic predominance then start patient on doxycycline and acyclovir.   [EH]  2127 Spoke with hospitalist, they will come see patient and admit.   [EH]    Clinical Course User Index [EH] Cristina GongHammond, Jerrod Damiano W, PA-C   Natale MilchBrian Kulkarni presents for evaluation of altered mental status with associated fever.  He is mildly agitated, asking the same questions repeatedly and according to his wife "has lost his filter."  Aside from change to mental status/personality patient is with out neurologic deficits.  Rectal temp attempted but patient became agitated and refused. No recent trauma history.   Patient has a history of multiple tick bites recently.  Extensive range of labs including HIV, RPR, RMSF, Lyme, troponin, CMP, CBC with Diff, lactic acid, blood cultures, ethanol, UDS, UA/culture were obtained. No rashes on exam with normal sodium and platelets.  CT head with out acute abnormality. Based on tick bites, symptoms and frequent urination ID was consulted early on for guidance in treatment.  ID suggested LP was attempted by Dr. Patria Maneampos with out success.  Patient was sent for floro guided LP which was successful.   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  At this time unclear on cause, but based on history highly suspicious of a tick borne cause.  UDS, ethanol, HIV, Troponin, lactic, White count all unremarkable.    Hospitalist was consulted for admission for continued work up of altered mental status.    The patient appears reasonably stabilized for admission considering the current resources, flow, and capabilities available in the ED at this time, and I doubt any other [facility] requiring further screening and/or treatment in the ED prior to admission.  The patient was discussed with and seen by both Dr. Denton LankSteinl and later Dr. Patria Maneampos who assisted  in the evaluation, plan, and work up of this patient.    Final Clinical Impressions(s) / ED Diagnoses   Final diagnoses:  Altered mental state  Tick bite    New Prescriptions New Prescriptions   No medications on file     Cristina GongHammond, Abbe Bula W, Cordelia Poche-C 02/13/17 2128  Azalia Bilis, MD 02/16/17 [phone]

## 2017-02-12 NOTE — ED Notes (Signed)
Pt resting on side, with family at the bedside.

## 2017-02-12 NOTE — ED Notes (Signed)
Pt back from radiology. Lumbar puncture successful and pt in no pain at this time. Pt resting, VSS, family at bedside. Pt's wife asked about MRI. Pt had recent right wrist surgery and she was told that "something had to be placed over pt's wrist if he goes to MRI" Dr Patria Maneampos notified and stated that MRI is on hold for now.

## 2017-02-12 NOTE — ED Notes (Signed)
Consent for lumbar puncture signed by Pt's wife at bedside with Dr Patria Maneampos

## 2017-02-12 NOTE — ED Notes (Signed)
Pt taken to x-ray for LP

## 2017-02-12 NOTE — H&P (Addendum)
History and Physical    Casey Yoder OZH:[id number] DOB: 1966-02-20 DOA: 02/12/2017  Referring MD/NP/PA: Dr. Jeraldine LootsHammond PCP: Arlan OrganManning, James S, MD  Patient coming from: via EMS  Chief Complaint: Confusion  HPI: Casey Yoder is a 51 y.o. male with medical history significant of bipolar 1 do, obesity, and OSA on CPAP; who presents with complaints of confusion. The history is obtained from the patient's wife who is present at bedside. She notes that he woke up around 4 AM and had complained about not sleeping well and a heaviness in his head. He went to work, but left early because he was feeling well. His wife reports talking to him around 10:30 AM as the patient was driving home and patient complained of continued heaviness in his head and whole body aches. When his wife got home she took him to his primary care, but along the way patient was more confused and kept asking what time it was and was not his normal self. At the PCPs office patient was given aspirin. He became more agitated and confused, and was sent to the emergency department for further evaluation. Associated symptoms included fever of 101F. I also notes that the patient has had 2 tick bites in the last 2 weeks that the patient just had picked off. 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  ED Course: Upon admission into the emergency department patient was seen to be afebrile with pulse 71-103, respirations 10-18, blood pressure as low as 87/59, and O2 saturations maintained. An extensive workup including CBC, CMP, toxicology screen, ammonia, UA, chest x-ray, and CT scan of the brain which all showed no acute signs of infection. Dr. Anne HahnSynder of Infectious disease was consulted and recommended doxycycline. A lumbar puncture was obtained by interventional radiology and CSF studies are pending.  Review of Systems: As per HPI otherwise 10 point review of systems negative.   Past Medical  History:  Diagnosis Date  . Bipolar 1 disorder   . Chest pain   . Obesity   . OBSTRUCTIVE SLEEP APNEA 10/26/2009   Qualifier: Diagnosis of  By: Shelle Ironlance MD, Maree KrabbeKeith M     Past Surgical History:  Procedure Laterality Date  . KNEE SURGERY  86     has no tobacco, alcohol, and drug history on file.  No Known Allergies  Family History  Problem Relation Age of Onset  . Heart disease Maternal Grandfather 50    Prior to Admission medications   Medication Sig Start Date End Date Taking? Authorizing Provider  MAGNESIUM PO Take 1 tablet by mouth every evening.   Yes [provider]  naproxen sodium (ANAPROX) 220 MG tablet Take 220 mg by mouth 2 (two) times daily as needed (pain).   Yes [provider]  POTASSIUM PO Take 1 tablet by mouth daily.   Yes [provider]    Physical Exam:   Constitutional: Obese male who appears to be confused and is actively following commands at this time. Vitals:   02/12/17 2030 02/12/17 2045 02/12/17 2115 02/12/17 2130  BP: 106/72 106/74 105/66 106/67  Pulse: 71 78 76 73  Resp: 15 14 14 14   Temp:      TempSrc:      SpO2: 94% 93% 94% 93%   Eyes: PERRL, lids and conjunctivae normal ENMT: Mucous membranes are dry. Posterior pharynx clear of any exudate or lesions. Normal dentition.  Neck: normal, supple, no masses, no thyromegaly Respiratory: clear  to auscultation bilaterally, no wheezing, no crackles. Normal respiratory effort. No accessory muscle use.  Cardiovascular: Regular rate and rhythm, no murmurs / rubs / gallops. No extremity edema. 2+ pedal pulses. No carotid bruits.  Abdomen: no tenderness, no masses palpated. No hepatosplenomegaly. Bowel sounds positive.  Musculoskeletal: no clubbing / cyanosis. No joint deformity upper and lower extremities. Good ROM, no contractures. Normal muscle tone.  Skin: Patient with scrapes and abrasions of the lower extremities with scabbed over tick bites of the upper right shoulder and  left lower leg.  Neurologic: CN 2-12 grossly intact. Sensation intact, DTR normal. Strength 5/5 in all 4.  Psychiatric:  Alert, but generally confused.     Labs on Admission: I have personally reviewed following labs and imaging studies  CBC:  Recent Labs Lab 02/12/17 1337  WBC 9.3  NEUTROABS 7.4  HGB 14.9  HCT 43.8  MCV 93.2  PLT 187   Basic Metabolic Panel:  Recent Labs Lab 02/12/17 1337 02/12/17 1622  NA 135 138  K 4.6 4.0  CL 102 107  CO2 24 25  GLUCOSE 96 95  BUN 13 13  CREATININE 1.18 1.14  CALCIUM 9.1 9.1   GFR: CrCl cannot be calculated (Unknown ideal weight.). Liver Function Tests:  Recent Labs Lab 02/12/17 1337  AST 30  ALT 15*  ALKPHOS 62  BILITOT 1.3*  PROT 6.4*  ALBUMIN 4.1   No results for input(s): LIPASE, AMYLASE in the last 168 hours.  Recent Labs Lab 02/12/17 1614  AMMONIA 26   Coagulation Profile: No results for input(s): INR, PROTIME in the last 168 hours. Cardiac Enzymes:  Recent Labs Lab 02/12/17 1337 02/12/17 1533  CKTOTAL  --  111  TROPONINI <0.03  --    BNP (last 3 results) No results for input(s): PROBNP in the last 8760 hours. HbA1C: No results for input(s): HGBA1C in the last 72 hours. CBG: No results for input(s): GLUCAP in the last 168 hours. Lipid Profile: No results for input(s): CHOL, HDL, LDLCALC, TRIG, CHOLHDL, LDLDIRECT in the last 72 hours. Thyroid Function Tests: No results for input(s): TSH, T4TOTAL, FREET4, T3FREE, THYROIDAB in the last 72 hours. Anemia Panel: No results for input(s): VITAMINB12, FOLATE, FERRITIN, TIBC, IRON, RETICCTPCT in the last 72 hours. Urine analysis:    Component Value Date/Time   COLORURINE COLORLESS (A) 02/12/2017 1350   APPEARANCEUR CLEAR 02/12/2017 1350   LABSPEC 1.002 (L) 02/12/2017 1350   PHURINE 7.0 02/12/2017 1350   GLUCOSEU NEGATIVE 02/12/2017 1350   HGBUR NEGATIVE 02/12/2017 1350   BILIRUBINUR NEGATIVE 02/12/2017 1350   KETONESUR NEGATIVE 02/12/2017 1350     PROTEINUR NEGATIVE 02/12/2017 1350   NITRITE NEGATIVE 02/12/2017 1350   LEUKOCYTESUR NEGATIVE 02/12/2017 1350   Sepsis Labs: Recent Results (from the past 240 hour(s))  CSF culture     Status: None (Preliminary result)   Collection Time: 02/12/17  8:38 PM  Result Value Ref Range Status   Specimen Description CSF  Final   Special Requests NONE  Final   Gram Stain   Final    WBC PRESENT, PREDOMINANTLY MONONUCLEAR NO ORGANISMS SEEN CYTOSPIN SMEAR    Culture PENDING  Incomplete   Report Status PENDING  Incomplete     Radiological Exams on Admission: Dg Chest 2 View  Result Date: 02/12/2017 CLINICAL DATA:  Confusion, febrile EXAM: CHEST  2 VIEW COMPARISON:  None. FINDINGS: No active infiltrate or effusion is seen. Mediastinal and hilar contours are unremarkable. The heart is within upper limits of normal. No  bony abnormality is seen. IMPRESSION: No active cardiopulmonary disease. Electronically Signed   By: Dwyane Dee M.D.   On: 02/12/2017 15:07   Ct Head Wo Contrast  Result Date: 02/12/2017 CLINICAL DATA:  Confusion EXAM: CT HEAD WITHOUT CONTRAST TECHNIQUE: Contiguous axial images were obtained from the base of the skull through the vertex without intravenous contrast. COMPARISON:  None FINDINGS: Brain: No evidence of acute infarction, hemorrhage, hydrocephalus, extra-axial collection or mass lesion/mass effect. Vascular: No hyperdense vessel or unexpected calcification. Skull: No osseous abnormality. Sinuses/Orbits: Visualized paranasal sinuses are clear. Visualized mastoid sinuses are clear. Visualized orbits demonstrate no focal abnormality. Other: None IMPRESSION: No acute intracranial pathology. Electronically Signed   By: Elige Ko   On: 02/12/2017 15:51   Dg Fluoro Guide Lumbar Puncture  Result Date: 02/12/2017 CLINICAL DATA:  Acute onset of confusion, orthostatic hypotension and fever. Nausea and increased urinary frequency. Initial encounter. EXAM: DIAGNOSTIC LUMBAR  PUNCTURE UNDER FLUOROSCOPIC GUIDANCE FLUOROSCOPY TIME:  Fluoroscopy Time:  12 seconds Number of Acquired Spot Images: 0 PROCEDURE: Informed consent was obtained from the patient's wife prior to the procedure, including potential complications of headache, allergy, and pain. With the patient prone, the lower back was prepped with Betadine. 1% Lidocaine was used for local anesthesia. Lumbar puncture was performed at the L4-L5 level using a 20 gauge needle with return of clear CSF. 10 mL of CSF were obtained for laboratory studies. The patient tolerated the procedure well and there were no apparent complications. IMPRESSION: Successful lumbar puncture, with return of clear CSF. Electronically Signed   By: Roanna Raider M.D.   On: 02/12/2017 21:36    EKG: Independently reviewed. Sinus tachycardia. Multiple PVCs.  Assessment/Plan Acute encephalopathy, Encephalitis/meningitis: Acute. 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 CT brain neg. Infectious disease was consulted and recommended starting patient on doxycycline.  - Admit to a telemetry bed - Neuro checks - Follow-up CSF and RPR - Follow-up MRI of brain - Ativan IV prn agitation - Doxycycline 100 mg  IV BID, added on Acyclovir - Appreciate ID consultative services, with follow-up for further recommendations  Fever, unknown origin:Acute. Pt fever up to 101 reported prior to arrival. - Continue monitoring.  - Tylenol as needed  Frequent PVCs - Check magnesium - Follow-up telemetry overnight  H/O Tick bites  Polyuria: Patient with complaints of urinary frequency. UA negative for any signs of infection. Question if related to encephalopathy above. - IVF NS at 100 ml/hr  OSA on CPAP - RT to supply CPAP at night   DVT prophylaxis: Lovenox Code Status: Full  Family Communication: Discussed plan of care with the patient and family present at bedside  Disposition  Plan: TBD  Consults called: ID  Admission status: Inpatient   Clydie Braun MD [facility] Pager [phone]  If 7PM-7AM, please contact night-coverage [email or web address] Password [facility]  02/12/2017, 10:18 PM

## 2017-02-12 NOTE — ED Notes (Signed)
Dr Patria Maneampos notified of pt's BP 89/70, gave verbal order for a 1L bolus of fluid

## 2017-02-12 NOTE — ED Notes (Signed)
Pt up to use restroom again

## 2017-02-12 NOTE — ED Triage Notes (Addendum)
Per EMS pt from PCP to be evaluated for confusion. Patient called wife at 10am states he doesn't feel well and left work early and made appointment with to Dr. Broadus JohnManning's office. At 11:45am at PCP, wife noted patient was noted to be confused with repetitive questioning about time and situation. Patient repeatedly asking if he will be catheterized due to his urinary symptoms. Patient has PTSD and + orthostatic. PCP gave 324 of ASA. No other neuro deficits noted. Patient endorses nausea and frequent urination. UA in office was negative. Patient notes multiple tick bites and is always outside.    Temp 102.1 (tympanic)  CBG 91 EKG- NSR BP (sitting)152/104 BP (standing) 126/80

## 2017-02-12 NOTE — ED Notes (Signed)
Pt still unable to sit still during LP, Dr Patria Maneampos ordered 1mg  versed. Given from initial vial used from first versed administration.

## 2017-02-12 NOTE — ED Provider Notes (Signed)
  Physical Exam  BP 106/74   Pulse 78   Temp 98.4 F (36.9 C) (Oral)   Resp 14   SpO2 93%   Physical Exam  ED Course  .Lumbar Puncture Performed by: Azalia BilisAMPOS, Aileana Hodder Authorized by: Azalia BilisAMPOS, Jaymz Traywick   Consent:    Consent obtained:  Verbal   Consent given by:  Patient and spouse   Risks discussed:  Bleeding, headache, infection, nerve damage, pain and repeat procedure   Alternatives discussed:  No treatment Pre-procedure details:    Procedure purpose:  Diagnostic   Preparation: Patient was prepped and draped in usual sterile fashion   Sedation:    Sedation type:  Anxiolysis Anesthesia (see MAR for exact dosages):    Anesthesia method:  Local infiltration   Local anesthetic:  Lidocaine 2% w/o epi Procedure details:    Lumbar space:  L3-L4 interspace   Patient position:  L lateral decubitus   Needle gauge:  20   Needle length (in):  2.5   Ultrasound guidance: no     Number of attempts:  5 or more Post-procedure:    Patient tolerance of procedure:  Procedure terminated at patient's request (Unsuccessful)    MDM Fever with picture of delirium/encephalitis.  Attempted lumbar puncture but was unsuccessful.  Patient underwent fluoroscopy guided LP.  Patient be admitted the hospital for ongoing workup of fever and altered mental status.  No clear etiology found at this time.         Azalia Bilisampos, Jadia Capers, MD 02/12/17 2104

## 2017-02-13 ENCOUNTER — Encounter (HOSPITAL_COMMUNITY): Payer: Self-pay

## 2017-02-13 DIAGNOSIS — R358 Other polyuria: Secondary | ICD-10-CM | POA: Diagnosis present

## 2017-02-13 DIAGNOSIS — R4182 Altered mental status, unspecified: Secondary | ICD-10-CM

## 2017-02-13 DIAGNOSIS — G9340 Encephalopathy, unspecified: Secondary | ICD-10-CM

## 2017-02-13 DIAGNOSIS — R3589 Other polyuria: Secondary | ICD-10-CM | POA: Diagnosis present

## 2017-02-13 LAB — COMPREHENSIVE METABOLIC PANEL
ALT: 17 U/L (ref 17–63)
AST: 17 U/L (ref 15–41)
Albumin: 3.6 g/dL (ref 3.5–5.0)
Alkaline Phosphatase: 51 U/L (ref 38–126)
Anion gap: 6 (ref 5–15)
BILIRUBIN TOTAL: 0.6 mg/dL (ref 0.3–1.2)
BUN: 11 mg/dL (ref 6–20)
CHLORIDE: 109 mmol/L (ref 101–111)
CO2: 25 mmol/L (ref 22–32)
CREATININE: 1.11 mg/dL (ref 0.61–1.24)
Calcium: 8.8 mg/dL — ABNORMAL LOW (ref 8.9–10.3)
Glucose, Bld: 115 mg/dL — ABNORMAL HIGH (ref 65–99)
POTASSIUM: 4.1 mmol/L (ref 3.5–5.1)
Sodium: 140 mmol/L (ref 135–145)
TOTAL PROTEIN: 6.3 g/dL — AB (ref 6.5–8.1)

## 2017-02-13 LAB — RPR
RPR Ser Ql: NONREACTIVE
RPR: NONREACTIVE

## 2017-02-13 LAB — CBC
HEMATOCRIT: 43.2 % (ref 39.0–52.0)
Hemoglobin: 14.2 g/dL (ref 13.0–17.0)
MCH: 31 pg (ref 26.0–34.0)
MCHC: 32.9 g/dL (ref 30.0–36.0)
MCV: 94.3 fL (ref 78.0–100.0)
PLATELETS: 157 10*3/uL (ref 150–400)
RBC: 4.58 MIL/uL (ref 4.22–5.81)
RDW: 12.6 % (ref 11.5–15.5)
WBC: 6.2 10*3/uL (ref 4.0–10.5)

## 2017-02-13 LAB — VDRL, CSF: VDRL Quant, CSF: NONREACTIVE

## 2017-02-13 LAB — HERPES SIMPLEX VIRUS(HSV) DNA BY PCR: HSV 1 DNA: NEGATIVE

## 2017-02-13 LAB — B. BURGDORFI ANTIBODIES: B burgdorferi Ab IgG+IgM: <0.91 {ISR} (ref 0.00–0.90)

## 2017-02-13 LAB — MAGNESIUM: MAGNESIUM: 1.9 mg/dL (ref 1.7–2.4)

## 2017-02-13 LAB — HSV DNA BY PCR (REFERENCE LAB): HSV 2 DNA: NEGATIVE

## 2017-02-13 MED ORDER — DEXTROSE 5 % IV SOLN
800.0000 mg | Freq: Three times a day (TID) | INTRAVENOUS | Status: DC
  Administered 2017-02-13 – 2017-02-14 (×4): 800 mg via INTRAVENOUS
  Filled 2017-02-13 (×5): qty 16

## 2017-02-13 MED ORDER — LORAZEPAM 2 MG/ML IJ SOLN
0.5000 mg | INTRAMUSCULAR | Status: DC | PRN
  Administered 2017-02-14: 0.5 mg via INTRAVENOUS
  Filled 2017-02-13: qty 1

## 2017-02-13 NOTE — Progress Notes (Signed)
Pharmacy Antibiotic Note  Casey MilchBrian Yoder is a 51 y.o. male admitted on 02/12/2017 with AMS, possible encephalitis.  Pharmacy has been consulted for acyclovir dosing.  Plan: Acyclovir 800 mg IV q8h  Height: 5\' 7"  (170.2 cm) Weight: 239 lb 8 oz (108.6 kg) IBW/kg (Calculated) : 66.1  Temp (24hrs), Avg:98.5 F (36.9 C), Min:97.9 F (36.6 C), Max:99.1 F (37.3 C)   Recent Labs Lab 02/12/17 1337 02/12/17 1402 02/12/17 1622 02/12/17 1637 02/12/17 1650 02/13/17 0015  WBC 9.3  --   --   --   --  6.2  CREATININE 1.18  --  1.14  --   --  1.11  LATICACIDVEN 0.7 1.27  --  0.9 0.81  --     Estimated Creatinine Clearance: 93.6 mL/min (by C-G formula based on SCr of 1.11 mg/dL).    No Known Allergies  Casey Yoder, Casey Yoder 02/13/2017 3:07 AM

## 2017-02-13 NOTE — Progress Notes (Signed)
[facility] Progress Note  Patient: Casey Yoder ZOX:[id number]   PCP: Arlan Organ, MD DOB: 19-Apr-1966   DOA: 02/12/2017   DOS: 02/13/2017   Date of Service: the patient was seen and examined on 02/13/2017  Subjective: feeling better, but still has neck stiffness, photophobia and phonophobia. Diffuse headache present, no focal deficit, confusion better  Brief hospital course: Pt. with PMH of Bipolar disorder, obesity, OSA on C Pap; admitted on 02/12/2017, presented with complaint of acute encephalopathy, was found to have suspected meningitis. Currently further plan is continue further workup.  Assessment and Plan: 1. Acute encephalopathy. Suspected meningitis. Possible tickborne illness. But 3 weeks ago, started confusion yesterday. Neurology was consulted. Concern for tickborne illness. ID was consulted overnight as well. Patient was started on IV doxycycline as well as IV acyclovir. Lumbar vertebral form. WBC 2 this likely meningitis. RMSF as well as Lyme titers currently pending. Still has meningismus. MRI brain with and without contrast ordered currently pending. EEG is also ordered with follow-up.  2. OSA on C Pap. Continue Cpap night.  3. Polyuria. UA unremarkable. No polyuria in the hospital. Monitor.  4. Frequent PVC. Appears to have resolved. Continue to monitor.   Diet: Cardiac diet DVT Prophylaxis: subcutaneous Heparin  Advance goals of care discussion: full code  Family Communication: family was present at bedside, at the time of interview. The pt provided permission to discuss medical plan with the family. Opportunity was given to ask question and all questions were answered satisfactorily.   Disposition:  Discharge to home.  Consultants: neurology Procedures: Lp  Antibiotics: Anti-infectives    Start     Dose/Rate Route Frequency Ordered Stop   02/13/17 0400  acyclovir (ZOVIRAX) 800 mg in dextrose 5 % 150 mL IVPB     800 mg 166 mL/hr  over 60 Minutes Intravenous Every 8 hours 02/13/17 0309     02/12/17 2300  doxycycline (VIBRAMYCIN) 100 mg in dextrose 5 % 250 mL IVPB     100 mg 125 mL/hr over 120 Minutes Intravenous 2 times daily 02/12/17 2231         Objective: Physical Exam: Vitals:   02/12/17 2344 02/13/17 0256 02/13/17 0445 02/13/17 1426  BP: 119/84  119/73 (!) 115/58  Pulse: 67  69 64  Resp: 18  18 18   Temp: 97.9 F (36.6 C)  98 F (36.7 C) 98.4 F (36.9 C)  TempSrc: Oral  Oral Oral  SpO2: 96%  98% 98%  Weight:  108.6 kg (239 lb 8 oz)    Height:  5\' 7"  (1.702 m)      Intake/Output Summary (Last 24 hours) at 02/13/17 1903 Last data filed at 02/13/17 1525  Gross per 24 hour  Intake          3703.67 ml  Output                0 ml  Net          3703.67 ml   Filed Weights   02/13/17 0256  Weight: 108.6 kg (239 lb 8 oz)   General: Alert, Awake and Oriented to Time, Place and Person. Appear in mild distress, affect appropriate Eyes: PERRL, Conjunctiva normal ENT: Oral Mucosa clear moist. Neck: no JVD, no Abnormal Mass Or lumps Cardiovascular: S1 and S2 Present, no Murmur, Peripheral Pulses Present Respiratory: normal respiratory effort, Bilateral Air entry equal and Decreased, no use of accessory muscle, Clear to Auscultation, no Crackles, no wheezes Abdomen: Bowel Sound present, Soft and no tenderness,  no hernia Skin: no redness, no Rash, no induration Extremities: no Pedal edema, no calf tenderness Neurologic: Grossly no focal neuro deficit. Bilaterally Equal motor strength Neck stiffness still present. Photophobia present photophobia present. No focal deficit.  Data Reviewed: CBC:  Recent Labs Lab 02/12/17 1337 02/13/17 0015  WBC 9.3 6.2  NEUTROABS 7.4  --   HGB 14.9 14.2  HCT 43.8 43.2  MCV 93.2 94.3  PLT 187 157   Basic Metabolic Panel:  Recent Labs Lab 02/12/17 1337 02/12/17 1622 02/13/17 0015  NA 135 138 140  K 4.6 4.0 4.1  CL 102 107 109  CO2 24 25 25   GLUCOSE 96 95  115*  BUN 13 13 11   CREATININE 1.18 1.14 1.11  CALCIUM 9.1 9.1 8.8*  MG  --   --  1.9    Liver Function Tests:  Recent Labs Lab 02/12/17 1337 02/13/17 0015  AST 30 17  ALT 15* 17  ALKPHOS 62 51  BILITOT 1.3* 0.6  PROT 6.4* 6.3*  ALBUMIN 4.1 3.6   No results for input(s): LIPASE, AMYLASE in the last 168 hours.  Recent Labs Lab 02/12/17 1614  AMMONIA 26   Coagulation Profile: No results for input(s): INR, PROTIME in the last 168 hours. Cardiac Enzymes:  Recent Labs Lab 02/12/17 1337 02/12/17 1533  CKTOTAL  --  111  TROPONINI <0.03  --    BNP (last 3 results) No results for input(s): PROBNP in the last 8760 hours. CBG: No results for input(s): GLUCAP in the last 168 hours. Studies: Dg Fluoro Guide Lumbar Puncture  Result Date: 02/12/2017 CLINICAL DATA:  Acute onset of confusion, orthostatic hypotension and fever. Nausea and increased urinary frequency. Initial encounter. EXAM: DIAGNOSTIC LUMBAR PUNCTURE UNDER FLUOROSCOPIC GUIDANCE FLUOROSCOPY TIME:  Fluoroscopy Time:  12 seconds Number of Acquired Spot Images: 0 PROCEDURE: Informed consent was obtained from the patient's wife prior to the procedure, including potential complications of headache, allergy, and pain. With the patient prone, the lower back was prepped with Betadine. 1% Lidocaine was used for local anesthesia. Lumbar puncture was performed at the L4-L5 level using a 20 gauge needle with return of clear CSF. 10 mL of CSF were obtained for laboratory studies. The patient tolerated the procedure well and there were no apparent complications. IMPRESSION: Successful lumbar puncture, with return of clear CSF. Electronically Signed   By: Roanna RaiderJeffery  Chang M.D.   On: 02/12/2017 21:36    Scheduled Meds: . enoxaparin (LOVENOX) injection  40 mg Subcutaneous Q24H   Continuous Infusions: . sodium chloride 100 mL/hr at 02/13/17 1639  . acyclovir Stopped (02/13/17 1442)  . doxycycline (VIBRAMYCIN) IV Stopped (02/13/17  1300)   PRN Meds: acetaminophen **OR** acetaminophen, albuterol, LORazepam, LORazepam, ondansetron **OR** ondansetron (ZOFRAN) IV  Time spent: 35 minutes  Author: Lynden OxfordPranav Marnae Madani, MD [facility] Pager: [phone] 02/13/2017 7:03 PM  If 7PM-7AM, please contact night-coverage at [email or web address], password [email or web address]

## 2017-02-13 NOTE — Procedures (Signed)
BIPAP is bedside and ready for use.  Patient's wife said she will place on patient when ready for bed.

## 2017-02-13 NOTE — Procedures (Signed)
BIPAP is bedside and ready for use. Patient said he will place on himself when ready for bed.

## 2017-02-13 NOTE — Consult Note (Addendum)
NEURO HOSPITALIST CONSULT NOTE   Requestig physician: Dr. Katrinka Blazing  Reason for Consult: Fever with delirium  History obtained from:  Patient, Wife and Chart     HPI:                                                                                                                                          Casey Yoder is an 51 y.o. male who presented to the ED for evaluation of headache, memory dysfunction, general malaise, fatigue, joint pain and drowsiness. Symptoms began yesterday on awakening at about 4 AM with insomnia and sensation of heaviness in his head. He went to a work meeting but left it early due to malaise. He was also experiencing new onset of polyuria, having urinated about 9 times over a relatively short time period. His wife drove him to his physicians office; on the way he "was more confused and kept asking what time it was and was not his normal self". He arrived at his PCP's office at about 11:45 AM and was noted to be confused with repeated questions about his situation, the time and events which had just occurred. Time to forgetting new information was as short as 1 minute. He also began to become somewhat agitated. Orthostatics were positive at his PCP's office. He had a fever of 101. U/A was reported to be negative.   In association with the above, he has had a nonthrobbing, holocephalic 5/10 headache.   He has had 3 recent tick bites, one on his arm and two on his leg, occurring about 2 weeks ago. There was no subsequent fever or rash, but the bites have been slow to heal. 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   In the ED, ID was consulted and recommended doxycycline. An LP performed by IR was unremarkable: Clear colorless CSF with glucose of 58, RBC count of 391 (most consistent with bloody tap), 2 WBC and protein of 34. Other CSF labs are pending.   RPR was nonreactive. Cryptococcal Ag negative. HIV testing negative. CSF  sample with no organisms seen under microscopy; culture results pending.   His PMHx includes OSA on CPAP, bipolar disorder and right hand and knee surgeries.   Past Medical History:  Diagnosis Date  . Bipolar 1 disorder (HCC)   . Chest pain   . Obesity   . OBSTRUCTIVE SLEEP APNEA 10/26/2009   Qualifier: Diagnosis of  By: Shelle Iron MD, Maree Krabbe     Past Surgical History:  Procedure Laterality Date  . KNEE SURGERY  22    Family History  Problem Relation Age of Onset  . Heart disease Maternal Grandfather 6    Social History:  reports that he has never smoked. He  has never used smokeless tobacco. He reports that he does not drink alcohol or use drugs.  No Known Allergies  HOME MEDICATIONS:                                                                                                                     Magnesium Naproxen Potassium   ROS:                                                                                                                                       As per HPI.   Blood pressure 119/84, pulse 67, temperature 97.9 F (36.6 C), temperature source Oral, resp. rate 18, SpO2 96 %.  General Examination:                                                                                                      HEENT-  [place]/AT. Neck is supple.  Lungs- Respirations unlabored Extremities- No edema. Right wrist splint noted.  Skin: 2 small eschars lower leg and one to forearm at locations of prior tick bites.   Neurological Examination Mental Status: Drowsy. Oriented to day, month, year, location, city, state and circumstance. Mild irritability. No dysarthria. Mild impairment of attention and concentration. Speech fluent with intact comprehension, repetition and naming. Simple calculations intact. Becomes agitated when asked to interpret abstract statements. Has some impairment of delayed recall.  Cranial Nerves: II: Visual fields intact with no extinction. PERRL. Photophobia  noted.   III,IV, VI: ptosis not present, EOMI without nystagmus V,VII: smile symmetric, facial temp sensation normal bilaterally VIII: hearing intact to conversation IX,X: palate rises symmetrically XI: symmetric shoulder shrug XII: midline tongue extension Motor: Right : Upper extremity   5/5    Left:     Upper extremity   5/5  Lower extremity   5/5     Lower extremity   5/5 Normal tone throughout; no atrophy noted No pronator drift.  Sensory: Temp and light touch intact x 4 without extinction.  Deep  Tendon Reflexes: 2+ and symmetric upper extremities and achilles. 2+ left patella; right patella deferred due to prior operation with tenderness Plantars: Right: downgoing   Left: downgoing Cerebellar: No ataxia with FNF bilaterally.  Gait: Deferred.   Lab Results: Basic Metabolic Panel:  Recent Labs Lab 02/12/17 1337 02/12/17 1622 02/13/17 0015  NA 135 138 140  K 4.6 4.0 4.1  CL 102 107 109  CO2 24 25 25   GLUCOSE 96 95 115*  BUN 13 13 11   CREATININE 1.18 1.14 1.11  CALCIUM 9.1 9.1 8.8*  MG  --   --  1.9    Liver Function Tests:  Recent Labs Lab 02/12/17 1337 02/13/17 0015  AST 30 17  ALT 15* 17  ALKPHOS 62 51  BILITOT 1.3* 0.6  PROT 6.4* 6.3*  ALBUMIN 4.1 3.6   No results for input(s): LIPASE, AMYLASE in the last 168 hours.  Recent Labs Lab 02/12/17 1614  AMMONIA 26    CBC:  Recent Labs Lab 02/12/17 1337 02/13/17 0015  WBC 9.3 6.2  NEUTROABS 7.4  --   HGB 14.9 14.2  HCT 43.8 43.2  MCV 93.2 94.3  PLT 187 157    Cardiac Enzymes:  Recent Labs Lab 02/12/17 1337 02/12/17 1533  CKTOTAL  --  111  TROPONINI <0.03  --     Lipid Panel: No results for input(s): CHOL, TRIG, HDL, CHOLHDL, VLDL, LDLCALC in the last 168 hours.  CBG: No results for input(s): GLUCAP in the last 168 hours.  Microbiology: Results for orders placed or performed during the hospital encounter of 02/12/17  CSF culture     Status: None (Preliminary result)   Collection  Time: 02/12/17  8:38 PM  Result Value Ref Range Status   Specimen Description CSF  Final   Special Requests NONE  Final   Gram Stain   Final    WBC PRESENT, PREDOMINANTLY MONONUCLEAR NO ORGANISMS SEEN CYTOSPIN SMEAR    Culture PENDING  Incomplete   Report Status PENDING  Incomplete    Coagulation Studies: No results for input(s): LABPROT, INR in the last 72 hours.  Imaging: Dg Chest 2 View  Result Date: 02/12/2017 CLINICAL DATA:  Confusion, febrile EXAM: CHEST  2 VIEW COMPARISON:  None. FINDINGS: No active infiltrate or effusion is seen. Mediastinal and hilar contours are unremarkable. The heart is within upper limits of normal. No bony abnormality is seen. IMPRESSION: No active cardiopulmonary disease. Electronically Signed   By: Dwyane DeePaul  Barry M.D.   On: 02/12/2017 15:07   Ct Head Wo Contrast  Result Date: 02/12/2017 CLINICAL DATA:  Confusion EXAM: CT HEAD WITHOUT CONTRAST TECHNIQUE: Contiguous axial images were obtained from the base of the skull through the vertex without intravenous contrast. COMPARISON:  None FINDINGS: Brain: No evidence of acute infarction, hemorrhage, hydrocephalus, extra-axial collection or mass lesion/mass effect. Vascular: No hyperdense vessel or unexpected calcification. Skull: No osseous abnormality. Sinuses/Orbits: Visualized paranasal sinuses are clear. Visualized mastoid sinuses are clear. Visualized orbits demonstrate no focal abnormality. Other: None IMPRESSION: No acute intracranial pathology. Electronically Signed   By: Elige KoHetal  Patel   On: 02/12/2017 15:51   Dg Fluoro Guide Lumbar Puncture  Result Date: 02/12/2017 CLINICAL DATA:  Acute onset of confusion, orthostatic hypotension and fever. Nausea and increased urinary frequency. Initial encounter. EXAM: DIAGNOSTIC LUMBAR PUNCTURE UNDER FLUOROSCOPIC GUIDANCE FLUOROSCOPY TIME:  Fluoroscopy Time:  12 seconds Number of Acquired Spot Images: 0 PROCEDURE: Informed consent was obtained from the patient's wife  prior to the procedure, including potential complications  of headache, allergy, and pain. With the patient prone, the lower back was prepped with Betadine. 1% Lidocaine was used for local anesthesia. Lumbar puncture was performed at the L4-L5 level using a 20 gauge needle with return of clear CSF. 10 mL of CSF were obtained for laboratory studies. The patient tolerated the procedure well and there were no apparent complications. IMPRESSION: Successful lumbar puncture, with return of clear CSF. Electronically Signed   By: Roanna Raider M.D.   On: 02/12/2017 21:36    Assessment: 51 year old male with 1 day history of antegrade memory loss in conjunction with headache, general malaise, fatigue, joint pain and drowsiness. Tick bites 2 weeks previously.  1. Neurological exam reveals mildly decreased attention and concentration in the setting of mild drowsiness. Mild antegrade memory dysfunction also noted.  2. CT head negative.  3. CSF initial labs are unremarkable: Clear colorless CSF with glucose of 58, RBC count of 391 (most consistent with bloody tap), 2 WBC and protein of 34. Other CSF labs are pending. Cryptococcal Ag negative. CSF sample with no organisms seen under microscopy.  4. CSF culture results pending. 5. RPR nonreactive.  HIV testing negative.  6. Polyuria. DDx includes diabetes insipidus, other dysfunction of hypothalamic/pituitary axis and psychogenic polydipsia.   Recommendations: 1. Agree with empiric doxycycline per ID consultant. 2. Agree with empiric acyclovir.  3. MRI brain with and without contrast. 4. EEG 5. Await results of CSF culture. 6. Lyme titers on CSF and serum (ordered).  7. RMSF antibody titers (ordered).    Electronically signed: Dr. Caryl Pina 02/13/2017, 2:49 AM

## 2017-02-14 ENCOUNTER — Inpatient Hospital Stay (HOSPITAL_COMMUNITY): Payer: BC Managed Care – PPO

## 2017-02-14 LAB — ROCKY MTN SPOTTED FVR ABS PNL(IGG+IGM)
RMSF IGM: 0.37 {index} (ref 0.00–0.89)
RMSF IgG: NEGATIVE

## 2017-02-14 LAB — B. BURGDORFI ANTIBODIES: B burgdorferi Ab IgG+IgM: <0.91 {ISR} (ref 0.00–0.90)

## 2017-02-14 MED ORDER — SACCHAROMYCES BOULARDII 250 MG PO CAPS
250.0000 mg | ORAL_CAPSULE | Freq: Two times a day (BID) | ORAL | Status: DC
  Administered 2017-02-14: 250 mg via ORAL
  Filled 2017-02-14: qty 1

## 2017-02-14 MED ORDER — GADOBENATE DIMEGLUMINE 529 MG/ML IV SOLN
20.0000 mL | Freq: Once | INTRAVENOUS | Status: AC | PRN
  Administered 2017-02-14: 20 mL via INTRAVENOUS

## 2017-02-14 MED ORDER — DOXYCYCLINE HYCLATE 100 MG PO TABS: 100.0000 mg | ORAL_TABLET | Freq: Two times a day (BID) | ORAL | Status: DC

## 2017-02-14 MED ORDER — DOXYCYCLINE HYCLATE 100 MG PO TABS: 100.0000 mg | ORAL_TABLET | Freq: Two times a day (BID) | ORAL | 0 refills | Status: AC

## 2017-02-14 MED ORDER — SACCHAROMYCES BOULARDII 250 MG PO CAPS: 250.0000 mg | ORAL_CAPSULE | Freq: Two times a day (BID) | ORAL | 0 refills | Status: AC

## 2017-02-14 NOTE — Progress Notes (Signed)
Casey MilchBrian Yoder to be D/C'd Home per MD order.  Discussed with the patient and all questions fully answered.  VSS, Skin clean, dry and intact without evidence of skin break down, no evidence of skin tears noted. IV catheter discontinued intact. Site without signs and symptoms of complications. Dressing and pressure applied.  An After Visit Summary was printed and given to the patient. Patient received prescription.  D/c education completed with patient/family including follow up instructions, medication list, d/c activities limitations if indicated, with other d/c instructions as indicated by MD - patient able to verbalize understanding, all questions fully answered.   Patient instructed to return to ED, call 911, or call MD for any changes in condition.   Patient escorted via WC, and D/C home via private auto.  Casey Needlesreti Casey Yoder 02/14/2017 12:28 PM

## 2017-02-14 NOTE — Progress Notes (Signed)
Subjective: Wife feels he is back to baseline  Exam: Vitals:   02/13/17 2201 02/14/17 0457  BP: 111/71 108/63  Pulse: 67 (!) 56  Resp: 18 18  Temp: 98.1 F (36.7 C) 97.7 F (36.5 C)   Gen: In bed, NAD Resp: non-labored breathing, no acute distress Abd: soft, nt  Neuro: MS: Awake, alert, oriented CN: Visual fields full, pupils were unreactive Motor: Full strength throughout Sensory: Intact light touch   Impression: 51 year old male with headache, confusion the setting of systemic illness. No evidence of meningitis on lumbar puncture and symptoms are resolved. At this time, I don't think that any further workup would be at all likely to yield further information regarding this episode, and therefore I don't have any further recommendations for neurodiagnostic studies.  Recommendations: 1) no further diagnostic testing from a neurological perspective at this time. 2) please call with further questions or concerns  Ritta SlotMcNeill Hilman Kissling, MD [facility] [phone]  If 7pm- 7am, please page neurology on call as listed in [facility].

## 2017-02-14 NOTE — Discharge Instructions (Signed)
Dehydration, Adult Dehydration is a condition in which there is not enough fluid or water in the body. This happens when you lose more fluids than you take in. Important organs, such as the kidneys, brain, and heart, cannot function without a proper amount of fluids. Any loss of fluids from the body can lead to dehydration. Dehydration can range from mild to severe. This condition should be treated right away to prevent it from becoming severe. What are the causes? This condition may be caused by:  Vomiting.  Diarrhea.  Excessive sweating, such as from heat exposure or exercise.  Not drinking enough fluid, especially: ? When ill. ? While doing activity that requires a lot of energy.  Excessive urination.  Fever.  Infection.  Certain medicines, such as medicines that cause the body to lose excess fluid (diuretics).  Inability to access safe drinking water.  Reduced physical ability to get adequate water and food.  What increases the risk? This condition is more likely to develop in people:  Who have a poorly controlled long-term (chronic) illness, such as diabetes, heart disease, or kidney disease.  Who are age 65 or older.  Who are disabled.  Who live in a place with high altitude.  Who play endurance sports.  What are the signs or symptoms? Symptoms of mild dehydration may include:  Thirst.  Dry lips.  Slightly dry mouth.  Dry, warm skin.  Dizziness. Symptoms of moderate dehydration may include:  Very dry mouth.  Muscle cramps.  Dark urine. Urine may be the color of tea.  Decreased urine production.  Decreased tear production.  Heartbeat that is irregular or faster than normal (palpitations).  Headache.  Light-headedness, especially when you stand up from a sitting position.  Fainting (syncope). Symptoms of severe dehydration may include:  Changes in skin, such as: ? Cold and clammy skin. ? Blotchy (mottled) or pale skin. ? Skin that does  not quickly return to normal after being lightly pinched and released (poor skin turgor).  Changes in body fluids, such as: ? Extreme thirst. ? No tear production. ? Inability to sweat when body temperature is high, such as in hot weather. ? Very little urine production.  Changes in vital signs, such as: ? Weak pulse. ? Pulse that is more than 100 beats a minute when sitting still. ? Rapid breathing. ? Low blood pressure.  Other changes, such as: ? Sunken eyes. ? Cold hands and feet. ? Confusion. ? Lack of energy (lethargy). ? Difficulty waking up from sleep. ? Short-term weight loss. ? Unconsciousness. How is this diagnosed? This condition is diagnosed based on your symptoms and a physical exam. Blood and urine tests may be done to help confirm the diagnosis. How is this treated? Treatment for this condition depends on the severity. Mild or moderate dehydration can often be treated at home. Treatment should be started right away. Do not wait until dehydration becomes severe. Severe dehydration is an emergency and it needs to be treated in a hospital. Treatment for mild dehydration may include:  Drinking more fluids.  Replacing salts and minerals in your blood (electrolytes) that you may have lost. Treatment for moderate dehydration may include:  Drinking an oral rehydration solution (ORS). This is a drink that helps you replace fluids and electrolytes (rehydrate). It can be found at pharmacies and retail stores. Treatment for severe dehydration may include:  Receiving fluids through an IV tube.  Receiving an electrolyte solution through a feeding tube that is passed through your nose   and into your stomach (nasogastric tube, or NG tube).  Correcting any abnormalities in electrolytes.  Treating the underlying cause of dehydration. Follow these instructions at home:  If directed by your health care provider, drink an ORS: ? Make an ORS by following instructions on the  package. ? Start by drinking small amounts, about  cup (120 mL) every 5-10 minutes. ? Slowly increase how much you drink until you have taken the amount recommended by your health care provider.  Drink enough clear fluid to keep your urine clear or pale yellow. If you were told to drink an ORS, finish the ORS first, then start slowly drinking other clear fluids. Drink fluids such as: ? Water. Do not drink only water. Doing that can lead to having too little salt (sodium) in the body (hyponatremia). ? Ice chips. ? Fruit juice that you have added water to (diluted fruit juice). ? Low-calorie sports drinks.  Avoid: ? Alcohol. ? Drinks that contain a lot of sugar. These include high-calorie sports drinks, fruit juice that is not diluted, and soda. ? Caffeine. ? Foods that are greasy or contain a lot of fat or sugar.  Take over-the-counter and prescription medicines only as told by your health care provider.  Do not take sodium tablets. This can lead to having too much sodium in the body (hypernatremia).  Eat foods that contain a healthy balance of electrolytes, such as bananas, oranges, potatoes, tomatoes, and spinach.  Keep all follow-up visits as told by your health care provider. This is important. Contact a health care provider if:  You have abdominal pain that: ? Gets worse. ? Stays in one area (localizes).  You have a rash.  You have a stiff neck.  You are more irritable than usual.  You are sleepier or more difficult to wake up than usual.  You feel weak or dizzy.  You feel very thirsty.  You have urinated only a small amount of very dark urine over 6-8 hours. Get help right away if:  You have symptoms of severe dehydration.  You cannot drink fluids without vomiting.  Your symptoms get worse with treatment.  You have a fever.  You have a severe headache.  You have vomiting or diarrhea that: ? Gets worse. ? Does not go away.  You have blood or green matter  (bile) in your vomit.  You have blood in your stool. This may cause stool to look black and tarry.  You have not urinated in 6-8 hours.  You faint.  Your heart rate while sitting still is over 100 beats a minute.  You have trouble breathing. This information is not intended to replace advice given to you by your health care provider. Make sure you discuss any questions you have with your health care provider. Document Released: 08/11/2005 Document Revised: 03/07/2016 Document Reviewed: 10/05/2015 [employer] Interactive Patient Education  2018 [employer]  

## 2017-02-15 LAB — ENTEROVIRUS PCR: Enterovirus PCR: NEGATIVE

## 2017-02-16 LAB — CSF CULTURE: CULTURE: NO GROWTH

## 2017-02-17 LAB — CULTURE, BLOOD (ROUTINE X 2)
CULTURE: NO GROWTH
CULTURE: NO GROWTH
SPECIAL REQUESTS: ADEQUATE
SPECIAL REQUESTS: ADEQUATE

## 2017-02-17 LAB — ROCKY MTN SPOTTED FVR ABS PNL(IGG+IGM)
RMSF IgG: NEGATIVE
RMSF IgM: 0.46 index (ref 0.00–0.89)

## 2017-02-17 LAB — ANAEROBIC CULTURE

## 2017-02-17 NOTE — Discharge Summary (Signed)
[facility] Discharge Summary   Patient: Casey Yoder ONG:[id number]   PCP: Arlan Organ, MD DOB: 1966-06-29   Date of admission: 02/12/2017   Date of discharge: 02/14/2017     Discharge Diagnoses:  Principal Problem:   Acute encephalopathy Active Problems:   OBSTRUCTIVE SLEEP APNEA   Obesity   Polyuria   Admitted From: home Disposition:  home  Recommendations for Outpatient Follow-up:  1. Please follow up with PCP in 1 week   Follow-up Information    Arlan Organ, MD. Schedule an appointment as soon as possible for a visit in 2 week(s).   Specialty:  Family Medicine Contact information: [address] [place] [address] [phone]          Diet recommendation: cardiac diet  Activity: The patient is advised to gradually reintroduce usual activities.  Discharge Condition: good  Code Status: full code  History of present illness: As per the H and P dictated on admission, "Cederick Broadnax is a 51 y.o. male with medical history significant of bipolar 1 do, obesity, and OSA on CPAP; who presents with complaints of confusion. The history is obtained from the patient's wife who is present at bedside. She notes that he woke up around 4 AM and had complained about not sleeping well and a heaviness in his head. He went to work, but left early because he was feeling well. His wife reports talking to him around 10:30 AM as the patient was driving home and patient complained of continued heaviness in his head and whole body aches. When his wife got home she took him to his primary care, but along the way patient was more confused and kept asking what time it was and was not his normal self. At the PCPs office patient was given aspirin. He became more agitated and confused, and was sent to the emergency department for further evaluation. Associated symptoms included fever of 101F. I also notes that the patient has had 2 tick bites in the last 2 weeks that the patient  just had picked off. 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  ED Course: Upon admission into the emergency department patient was seen to be afebrile with pulse 71-103, respirations 10-18, blood pressure as low as 87/59, and O2 saturations maintained. An extensive workup including CBC, CMP, toxicology screen, ammonia, UA, chest x-ray, and CT scan of the brain which all showed no acute signs of infection. Dr. Anne Hahn of Infectious disease was consulted and recommended doxycycline. A lumbar puncture was obtained by interventional radiology and CSF studies are pending."  Hospital Course:  Summary of his active problems in the hospital is as following. Acute encephalopathy,  Suspected Encephalitis/meningitis but ruled out Likely metabolic as well as in the setting of infection. 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  CT brain neg.  LP was performed in ER and pt was discussed with Infectious disease. Started patient on doxycycline. And acyclovir. CSF results for WBC, protein, glucose, VDRL, HSV PCR were normal. HIV, crypto were negative Pt showed remarkable improvement in symptoms  Neuro felt likely confusion in the setting of systemic illness, MRI brain with and without contrast was also negative for any acute abnormality. Discussed with ID and they recommended to treat pt with doxycycline to complete course due to presentation and rapid improvement  After initiation of the therapy.   Frequent  PVCs Likely associated with OSA. no VTACH. Resolved.  Monitor   Polyuria:  Patient with complaints of urinary frequency.  UA negative for any signs of infection.  Question if related to encephalopathy above. Recommended outpatient work up for polyurea should the symptoms persist.   OSA on CPAP Continue CPAP at night, question if the encephalopathy is associated  with hypoxia/hypercapnia and may need re titration of CPAP setting.   All other chronic medical condition were stable during the hospitalization.  Patient was ambulatory without any assistance. On the day of the discharge the patient's vitals were stable, and no other acute medical condition were reported by patient. the patient was felt safe to be discharge at home with family.  Procedures and Results:  LP   Consultations:  Neurology  DISCHARGE MEDICATION: Discharge Medication List as of 02/14/2017 12:27 PM    START taking these medications   Details  doxycycline (VIBRA-TABS) 100 MG tablet Take 1 tablet (100 mg total) by mouth every 12 (twelve) hours., Starting Sat 02/14/2017, Until Thu 02/19/2017, Normal    saccharomyces boulardii (FLORASTOR) 250 MG capsule Take 1 capsule (250 mg total) by mouth 2 (two) times daily., Starting Sat 02/14/2017, Until Sat 02/21/2017, Normal      CONTINUE these medications which have NOT CHANGED   Details  MAGNESIUM PO Take 1 tablet by mouth every evening., Historical Med    naproxen sodium (ANAPROX) 220 MG tablet Take 220 mg by mouth 2 (two) times daily as needed (pain)., Historical Med    POTASSIUM PO Take 1 tablet by mouth daily., Historical Med       No Known Allergies Discharge Instructions    Diet - low sodium heart healthy    Complete by:  As directed    Discharge instructions    Complete by:  As directed    It is important that you read following instructions as well as go over your medication list with RN to help you understand your care after this hospitalization.  Discharge Instructions: Please follow-up with PCP in one week  Please request your primary care physician to go over all Hospital Tests and Procedure/Radiological results at the follow up,  Please get all Hospital records sent to your PCP by signing hospital release before you go home.   Do not take more than prescribed Pain, Sleep and Anxiety Medications. You were cared  for by a hospitalist during your hospital stay. If you have any questions about your discharge medications or the care you received while you were in the hospital after you are discharged, you can call the unit and ask to speak with the hospitalist on call if the hospitalist that took care of you is not available.  Once you are discharged, your primary care physician will handle any further medical issues. Please note that NO REFILLS for any discharge medications will be authorized once you are discharged, as it is imperative that you return to your primary care physician (or establish a relationship with a primary care physician if you do not have one) for your aftercare needs so that they can reassess your need for medications and monitor your lab values. You Must read complete instructions/literature along with all the possible adverse reactions/side effects for all the Medicines you take and that have been prescribed to you. Take any new Medicines after you have completely understood and accept all the possible adverse reactions/side effects. Wear Seat belts while driving. If you have smoked or chewed Tobacco in the last 2 yrs  please stop smoking and/or stop any Recreational drug use.   Discharge instructions    Complete by:  As directed    Follow these instructions at home: If directed by your health care provider, drink an ORS: Make an ORS by following instructions on the package. Start by drinking small amounts, about  cup (120 mL) every 5-10 minutes. Slowly increase how much you drink until you have taken the amount recommended by your health care provider. Drink enough clear fluid to keep your urine clear or pale yellow. If you were told to drink an ORS, finish the ORS first, then start slowly drinking other clear fluids. Drink fluids such as: Water. Do not drink only water. Doing that can lead to having too little salt (sodium) in the body (hyponatremia). Ice chips. Fruit juice that you  have added water to (diluted fruit juice). Low-calorie sports drinks. Avoid: Alcohol. Drinks that contain a lot of sugar. These include high-calorie sports drinks, fruit juice that is not diluted, and soda. Caffeine. Foods that are greasy or contain a lot of fat or sugar. Take over-the-counter and prescription medicines only as told by your health care provider. Do not take sodium tablets. This can lead to having too much sodium in the body (hypernatremia). Eat foods that contain a healthy balance of electrolytes, such as bananas, oranges, potatoes, tomatoes, and spinach. Keep all follow-up visits as told by your health care provider. This is important.   Encourage fluids    Complete by:  As directed    Increase activity slowly    Complete by:  As directed      Discharge Exam: Filed Weights   02/13/17 0256  Weight: 108.6 kg (239 lb 8 oz)   Vitals:   02/13/17 2201 02/14/17 0457  BP: 111/71 108/63  Pulse: 67 (!) 56  Resp: 18 18  Temp: 98.1 F (36.7 C) 97.7 F (36.5 C)   General: Appear in no distress, no Rash; Oral Mucosa moist. Scattered scratch marks on legs Cardiovascular: S1 and S2 Present, no Murmur, no JVD Respiratory: Bilateral Air entry present and Clear to Auscultation, no Crackles, no wheezes Abdomen: Bowel Sound present, Soft and no tenderness Extremities: no Pedal edema, no calf tenderness Neurology: Grossly no focal neuro deficit.  The results of significant diagnostics from this hospitalization (including imaging, microbiology, ancillary and laboratory) are listed below for reference.    Significant Diagnostic Studies: Dg Chest 2 View  Result Date: 02/12/2017 CLINICAL DATA:  Confusion, febrile EXAM: CHEST  2 VIEW COMPARISON:  None. FINDINGS: No active infiltrate or effusion is seen. Mediastinal and hilar contours are unremarkable. The heart is within upper limits of normal. No bony abnormality is seen. IMPRESSION: No active cardiopulmonary disease.  Electronically Signed   By: Dwyane DeePaul  Barry M.D.   On: 02/12/2017 15:07   Ct Head Wo Contrast  Result Date: 02/12/2017 CLINICAL DATA:  Confusion EXAM: CT HEAD WITHOUT CONTRAST TECHNIQUE: Contiguous axial images were obtained from the base of the skull through the vertex without intravenous contrast. COMPARISON:  None FINDINGS: Brain: No evidence of acute infarction, hemorrhage, hydrocephalus, extra-axial collection or mass lesion/mass effect. Vascular: No hyperdense vessel or unexpected calcification. Skull: No osseous abnormality. Sinuses/Orbits: Visualized paranasal sinuses are clear. Visualized mastoid sinuses are clear. Visualized orbits demonstrate no focal abnormality. Other: None IMPRESSION: No acute intracranial pathology. Electronically Signed   By: Elige KoHetal  Vallie Teters   On: 02/12/2017 15:51   Mr Brain W Wo Contrast  Result Date: 02/14/2017 CLINICAL DATA:  Acute encephalopathy. EXAM:  MRI HEAD WITHOUT AND WITH CONTRAST TECHNIQUE: Multiplanar, multiecho pulse sequences of the brain and surrounding structures were obtained without and with intravenous contrast. CONTRAST:  20mL MULTIHANCE GADOBENATE DIMEGLUMINE 529 MG/ML IV SOLN COMPARISON:  Head CT 02/12/2017 FINDINGS: Brain: There is no evidence of acute infarct, intracranial hemorrhage, mass, midline shift, or extra-axial fluid collection. The ventricles and sulci are normal. The brain is normal in signal. No abnormal enhancement is identified. Vascular: Major intracranial vascular flow voids are preserved. Skull and upper cervical spine: Unremarkable bone marrow signal. Sinuses/Orbits: Unremarkable orbits. Scattered mild paranasal sinus mucosal thickening. Clear mastoid air cells. Other: None. IMPRESSION: Unremarkable appearance of the brain. Electronically Signed   By: Sebastian Ache M.D.   On: 02/14/2017 09:41   Dg Fluoro Guide Lumbar Puncture  Result Date: 02/12/2017 CLINICAL DATA:  Acute onset of confusion, orthostatic hypotension and fever. Nausea and  increased urinary frequency. Initial encounter. EXAM: DIAGNOSTIC LUMBAR PUNCTURE UNDER FLUOROSCOPIC GUIDANCE FLUOROSCOPY TIME:  Fluoroscopy Time:  12 seconds Number of Acquired Spot Images: 0 PROCEDURE: Informed consent was obtained from the patient's wife prior to the procedure, including potential complications of headache, allergy, and pain. With the patient prone, the lower back was prepped with Betadine. 1% Lidocaine was used for local anesthesia. Lumbar puncture was performed at the L4-L5 level using a 20 gauge needle with return of clear CSF. 10 mL of CSF were obtained for laboratory studies. The patient tolerated the procedure well and there were no apparent complications. IMPRESSION: Successful lumbar puncture, with return of clear CSF. Electronically Signed   By: Roanna Raider M.D.   On: 02/12/2017 21:36    Microbiology: Recent Results (from the past 240 hour(s))  Blood culture (routine x 2)     Status: None (Preliminary result)   Collection Time: 02/12/17  1:50 PM  Result Value Ref Range Status   Specimen Description BLOOD LEFT HAND  Final   Special Requests   Final    BOTTLES DRAWN AEROBIC AND ANAEROBIC Blood Culture adequate volume   Culture NO GROWTH 4 DAYS  Final   Report Status PENDING  Incomplete  Blood culture (routine x 2)     Status: None (Preliminary result)   Collection Time: 02/12/17  2:37 PM  Result Value Ref Range Status   Specimen Description BLOOD LEFT ANTECUBITAL  Final   Special Requests IN PEDIATRIC BOTTLE Blood Culture adequate volume  Final   Culture NO GROWTH 4 DAYS  Final   Report Status PENDING  Incomplete  CSF culture     Status: None   Collection Time: 02/12/17  8:38 PM  Result Value Ref Range Status   Specimen Description CSF  Final   Special Requests NONE  Final   Gram Stain   Final    WBC PRESENT, PREDOMINANTLY MONONUCLEAR NO ORGANISMS SEEN CYTOSPIN SMEAR    Culture NO GROWTH 3 DAYS  Final   Report Status 02/16/2017 FINAL  Final  Anaerobic  culture     Status: None   Collection Time: 02/12/17  8:38 PM  Result Value Ref Range Status   Specimen Description CSF  Final   Special Requests NONE  Final   Culture NO ANAEROBES ISOLATED  Final   Report Status 02/17/2017 FINAL  Final     Labs: CBC:  Recent Labs Lab 02/12/17 1337 02/13/17 0015  WBC 9.3 6.2  NEUTROABS 7.4  --   HGB 14.9 14.2  HCT 43.8 43.2  MCV 93.2 94.3  PLT 187 157   Basic Metabolic Panel:  Recent Labs Lab 02/12/17 1337 02/12/17 1622 02/13/17 0015  NA 135 138 140  K 4.6 4.0 4.1  CL 102 107 109  CO2 24 25 25   GLUCOSE 96 95 115*  BUN 13 13 11   CREATININE 1.18 1.14 1.11  CALCIUM 9.1 9.1 8.8*  MG  --   --  1.9   Liver Function Tests:  Recent Labs Lab 02/12/17 1337 02/13/17 0015  AST 30 17  ALT 15* 17  ALKPHOS 62 51  BILITOT 1.3* 0.6  PROT 6.4* 6.3*  ALBUMIN 4.1 3.6   No results for input(s): LIPASE, AMYLASE in the last 168 hours.  Recent Labs Lab 02/12/17 1614  AMMONIA 26   Cardiac Enzymes:  Recent Labs Lab 02/12/17 1337 02/12/17 1533  CKTOTAL  --  111  TROPONINI <0.03  --    BNP (last 3 results) No results for input(s): BNP in the last 8760 hours. CBG: No results for input(s): GLUCAP in the last 168 hours. Time spent: 35 minutes  Signed:  Lynden Oxford  [facility] 02/14/2017 , 1:43 PM

## 2017-02-18 LAB — EHRLICHIA ANTIBODY PANEL
E CHAFFEENSIS AB, IGG: NEGATIVE
E chaffeensis (HGE) Ab, IgM: NEGATIVE
E. Chaffeensis (HME) IgM Titer: NEGATIVE
E.Chaffeensis (HME) IgG: NEGATIVE

## 2017-02-19 LAB — ARBOVIRUS IGG, CSF: West Nile IgG CSF: 0.17 IV (ref <1.30)

## 2017-02-20 LAB — B. BURGDORFI ANTIBODIES, CSF
B. Burgdorferi IgM Ab Index: 0.2 Index (ref <1.0)
LYME AB: 0.5 {index} (ref <1.0)

## 2017-03-10 LAB — ARBOVIRUS PANEL, ~~LOC~~ LAB

## 2017-04-09 ENCOUNTER — Encounter: Payer: Self-pay | Admitting: Internal Medicine

## 2017-06-23 ENCOUNTER — Encounter: Payer: BC Managed Care – PPO | Admitting: Internal Medicine

## 2017-06-30 ENCOUNTER — Encounter: Payer: Self-pay | Admitting: Family Medicine

## 2018-05-02 IMAGING — MR MR HEAD WO/W CM
9 of 12 series · 37 of 48 positions shown · IV contrast (multihance)
Comparison: Head CT 02/12/2017

CLINICAL DATA: Acute encephalopathy.

EXAM:
MRI HEAD WITHOUT AND WITH CONTRAST
TECHNIQUE: Multiplanar, multiecho pulse sequences of the brain and surrounding
structures were obtained without and with intravenous contrast.
CONTRAST:  20mL MULTIHANCE GADOBENATE DIMEGLUMINE 529 MG/ML IV SOLN

[Series 2: FLAIR · sagittal · 5.0mm · 0.47mm/px · 1 of 23 slices shown (1 of 2)]
[im 1/23]
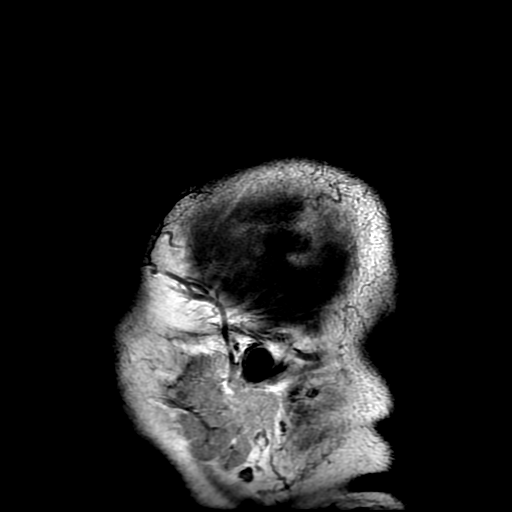

[Series 4: DWI · axial · 3.0mm · 0.94mm/px · z∈[-35,+112]mm · 9 of 100 slices shown (1 of 2)]
[im 1/100]
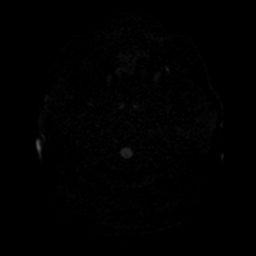
[im 13/100]
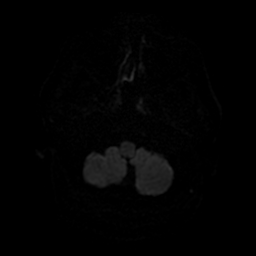
[im 25/100]
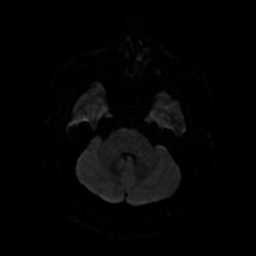
[im 38/100]
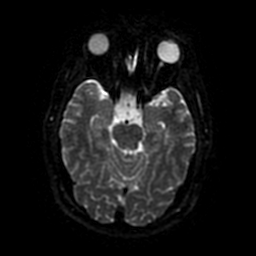
[im 50/100]
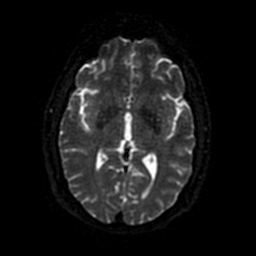
[im 62/100]
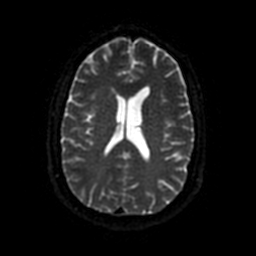
[im 75/100]
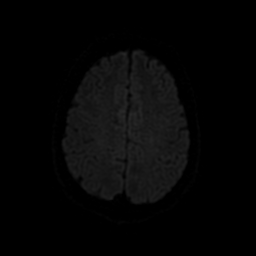
[im 87/100]
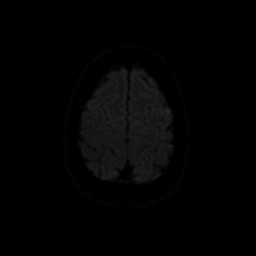
[im 100/100]
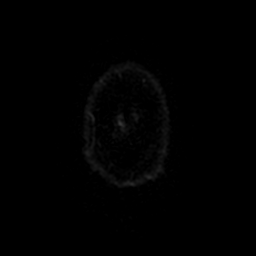

[Series 5: T2 · axial · 5.0mm · 0.43mm/px · z∈[-34,+109]mm · 3 of 25 slices shown (1 of 2)]
[im 1/25]
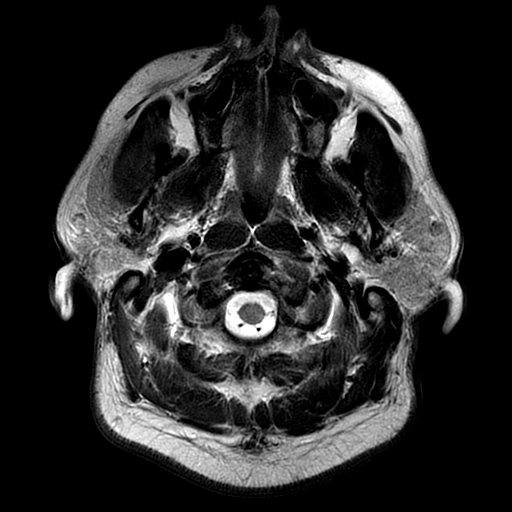
[im 13/25]
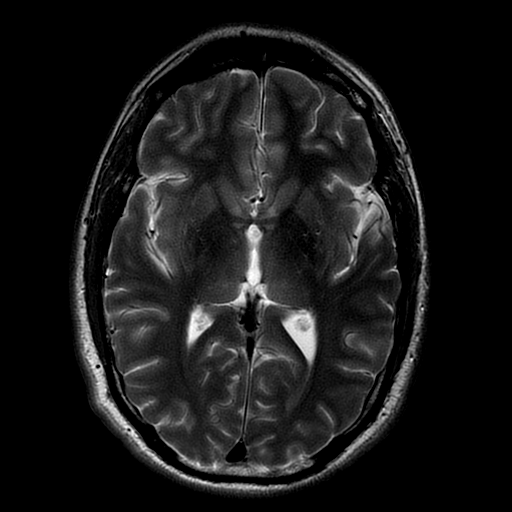
[im 25/25]
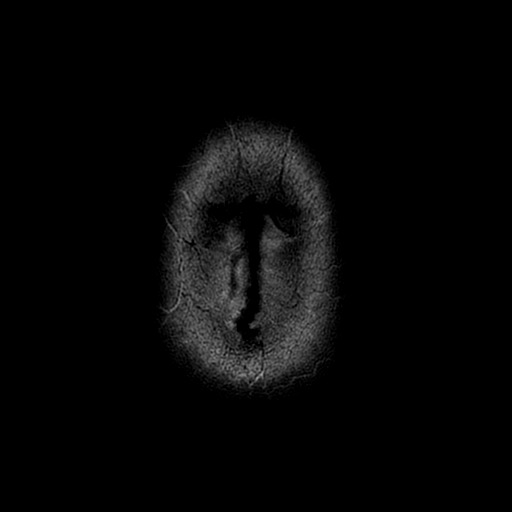

[Series 6: FLAIR · axial · 5.0mm · 0.43mm/px · z∈[-34,+109]mm · 3 of 25 slices shown (2 of 2)]
[im 1/25]
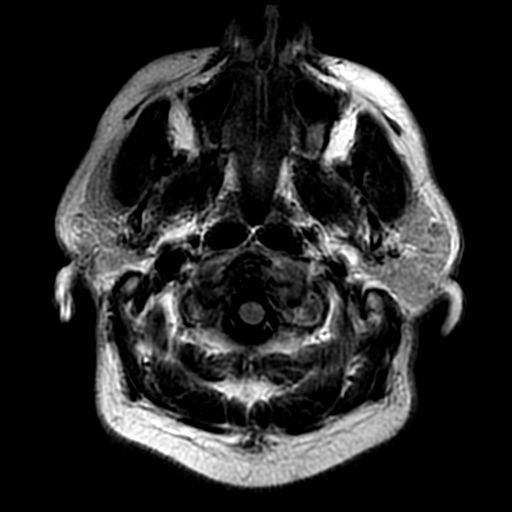
[im 13/25]
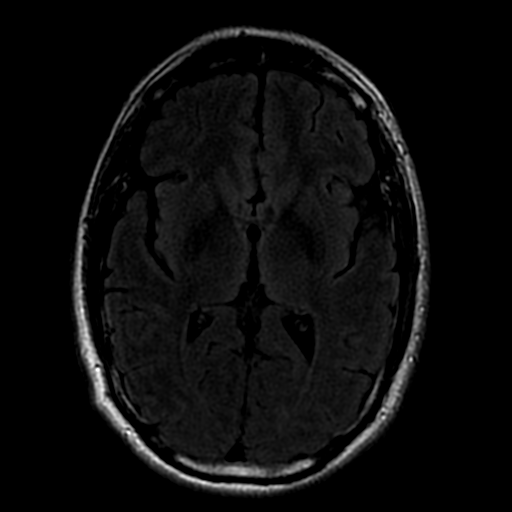
[im 25/25]
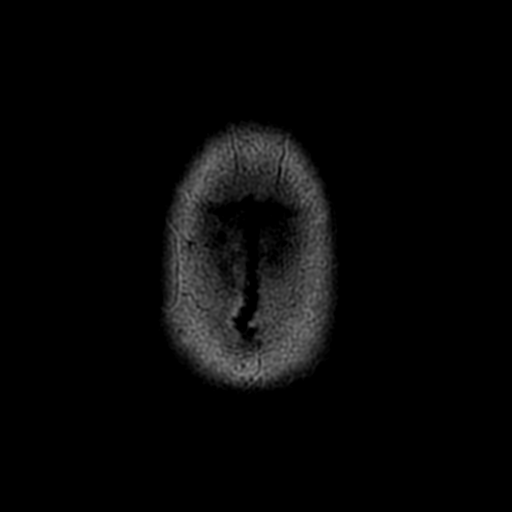

[Series 7: GRE · axial · 5.0mm · 0.43mm/px · 1 of 25 slices shown]
[im 1/25]
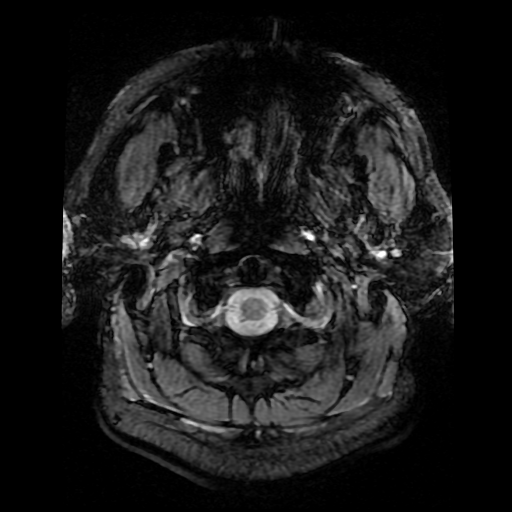

[Series 8: DWI · coronal · 4.0mm · 0.94mm/px · 8 of 74 slices shown (2 of 2)]
[im 1/74]
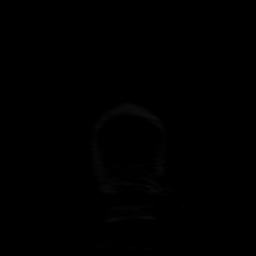
[im 11/74]
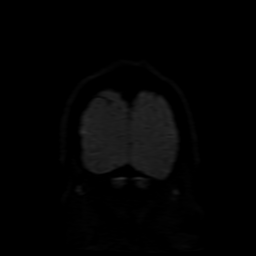
[im 21/74]
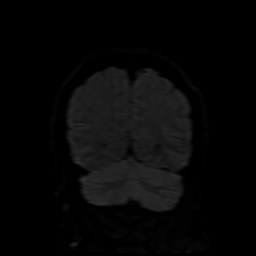
[im 32/74]
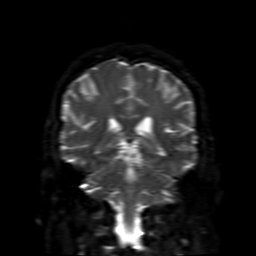
[im 42/74]
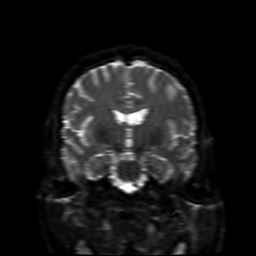
[im 53/74]
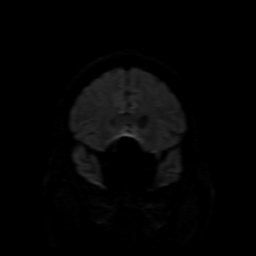
[im 63/74]
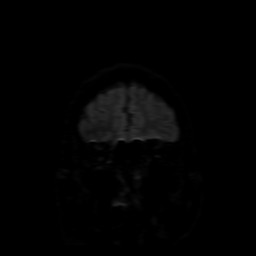
[im 74/74]
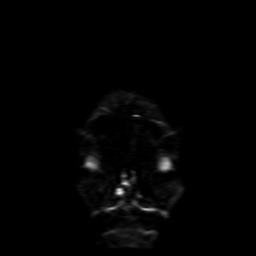

[Series 10: T2 · coronal · 5.0mm · 0.47mm/px · 3 of 31 slices shown (2 of 2)]
[im 1/31]
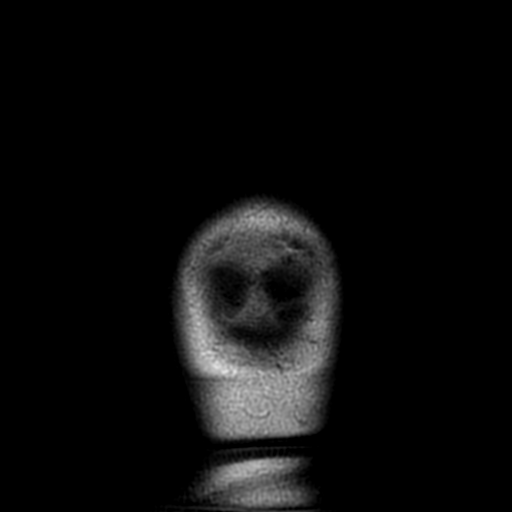
[im 16/31]
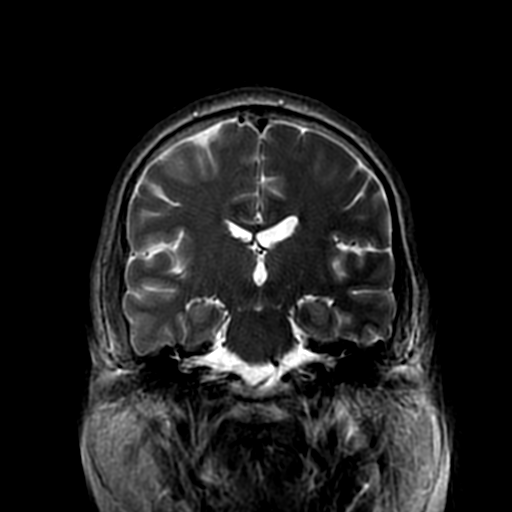
[im 31/31]
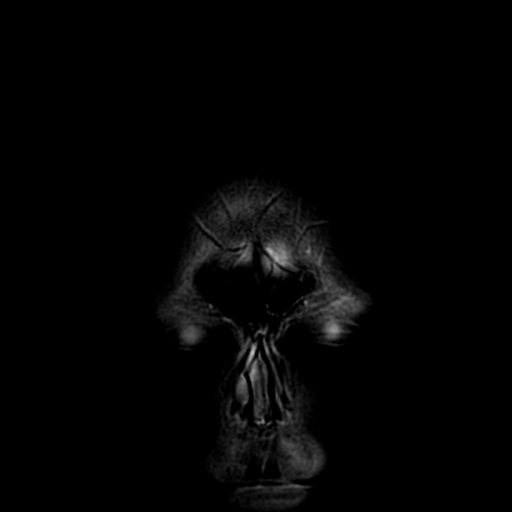

[Series 450: ADC · axial · 3.0mm · 0.94mm/px · z∈[-35,+112]mm · 5 of 50 slices shown (1 of 2)]
[im 1/50]
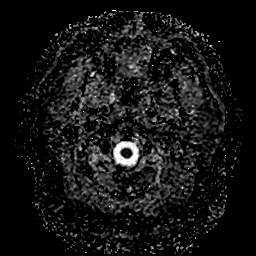
[im 13/50]
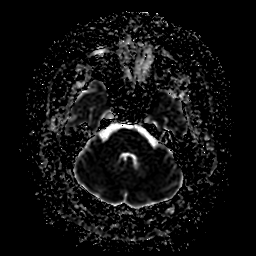
[im 25/50]
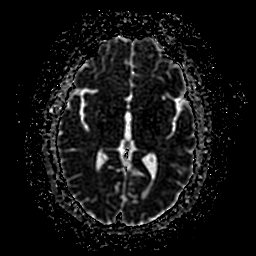
[im 37/50]
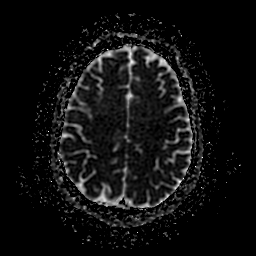
[im 50/50]
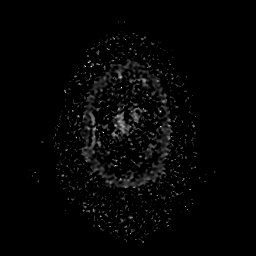

[Series 850: ADC · coronal · 4.0mm · 0.94mm/px · 4 of 37 slices shown (2 of 2)]
[im 1/37]
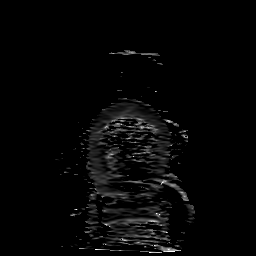
[im 13/37]
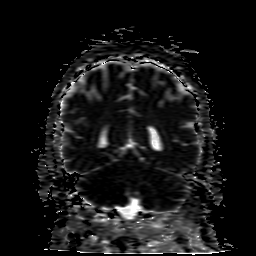
[im 25/37]
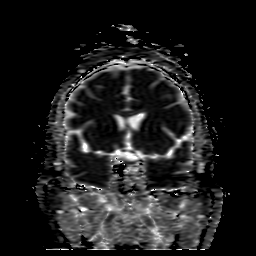
[im 37/37]
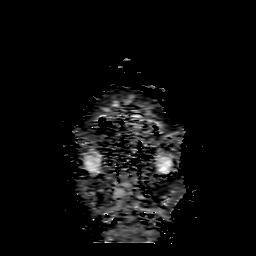

[37 of 48 positions shown; findings below may reference images not displayed]

FINDINGS: Brain: There is no evidence of acute infarct, intracranial
hemorrhage, mass, midline shift, or extra-axial fluid collection.
The ventricles and sulci are normal. The brain is normal in signal.
No abnormal enhancement is identified.

Vascular: Major intracranial vascular flow voids are preserved.

Skull and upper cervical spine: Unremarkable bone marrow signal.

Sinuses/Orbits: Unremarkable orbits. Scattered mild paranasal sinus
mucosal thickening. Clear mastoid air cells.

Other: None.
IMPRESSION: Unremarkable appearance of the brain.

## 2021-02-01 ENCOUNTER — Other Ambulatory Visit: Payer: Self-pay

## 2021-02-01 ENCOUNTER — Emergency Department (HOSPITAL_COMMUNITY): Payer: No Typology Code available for payment source

## 2021-02-01 ENCOUNTER — Emergency Department (HOSPITAL_COMMUNITY)
Admission: EM | Admit: 2021-02-01 | Discharge: 2021-02-01 | Disposition: A | Discharge status: Home / Self Care | Payer: No Typology Code available for payment source | Attending: Emergency Medicine | Admitting: Emergency Medicine

## 2021-02-01 DIAGNOSIS — R42 Dizziness and giddiness: Secondary | ICD-10-CM | POA: Insufficient documentation

## 2021-02-01 DIAGNOSIS — R0789 Other chest pain: Secondary | ICD-10-CM | POA: Diagnosis not present

## 2021-02-01 DIAGNOSIS — R079 Chest pain, unspecified: Secondary | ICD-10-CM

## 2021-02-01 LAB — CBC WITH DIFFERENTIAL/PLATELET
Abs Immature Granulocytes: 0.02 10*3/uL (ref 0.00–0.07)
Basophils Absolute: 0 10*3/uL (ref 0.0–0.1)
Basophils Relative: 0 %
Eosinophils Absolute: 0.1 10*3/uL (ref 0.0–0.5)
Eosinophils Relative: 2 %
HCT: 41.9 % (ref 39.0–52.0)
Hemoglobin: 14.2 g/dL (ref 13.0–17.0)
Immature Granulocytes: 0 %
Lymphocytes Relative: 19 %
Lymphs Abs: 1.5 10*3/uL (ref 0.7–4.0)
MCH: 32.7 pg (ref 26.0–34.0)
MCHC: 33.9 g/dL (ref 30.0–36.0)
MCV: 96.5 fL (ref 80.0–100.0)
Monocytes Absolute: 0.5 10*3/uL (ref 0.1–1.0)
Monocytes Relative: 6 %
Neutro Abs: 5.8 10*3/uL (ref 1.7–7.7)
Neutrophils Relative %: 73 %
Platelets: 223 10*3/uL (ref 150–400)
RBC: 4.34 MIL/uL (ref 4.22–5.81)
RDW: 11.9 % (ref 11.5–15.5)
WBC: 7.9 10*3/uL (ref 4.0–10.5)
nRBC: 0 % (ref 0.0–0.2)

## 2021-02-01 LAB — COMPREHENSIVE METABOLIC PANEL
ALT: 18 U/L (ref 0–44)
AST: 20 U/L (ref 15–41)
Albumin: 4 g/dL (ref 3.5–5.0)
Alkaline Phosphatase: 56 U/L (ref 38–126)
Anion gap: 10 (ref 5–15)
BUN: 10 mg/dL (ref 6–20)
CO2: 24 mmol/L (ref 22–32)
Calcium: 10 mg/dL (ref 8.9–10.3)
Chloride: 106 mmol/L (ref 98–111)
Creatinine, Ser: 1.32 mg/dL — ABNORMAL HIGH (ref 0.61–1.24)
GFR, Estimated: >60 mL/min (ref >60)
Glucose, Bld: 90 mg/dL (ref 70–99)
Potassium: 4 mmol/L (ref 3.5–5.1)
Sodium: 140 mmol/L (ref 135–145)
Total Bilirubin: 0.6 mg/dL (ref 0.3–1.2)
Total Protein: 6.7 g/dL (ref 6.5–8.1)

## 2021-02-01 LAB — TROPONIN I (HIGH SENSITIVITY): Troponin I (High Sensitivity): 3 ng/L (ref <18)

## 2021-02-01 LAB — LIPASE, BLOOD: Lipase: 30 U/L (ref 11–51)

## 2021-02-01 MED ORDER — DOXYCYCLINE HYCLATE 100 MG PO CAPS: 100.0000 mg | ORAL_CAPSULE | Freq: Two times a day (BID) | ORAL | 0 refills | Status: DC

## 2021-02-01 NOTE — ED Notes (Signed)
Patient left without discharge paperwork. RN attempted to contact patient, and patient spouse twice with no response from patient or spouse. Discharge instructions covered in detail with patient and significant other prior to leaving.

## 2021-02-01 NOTE — ED Triage Notes (Signed)
Pt at work and began having nagging pressure in chest with associated dizziness and vomiting. Same happened one month ago but was not evaluated at that time. Pt had relief after EMS admin of ASA and nitro, but then had epigastric pain as they were pulling into ED.

## 2021-02-01 NOTE — ED Provider Notes (Signed)
Emergency Medicine Provider Triage Evaluation Note  Casey Yoder , a 55 y.o. male  was evaluated in triage.  Pt complains of chest pain, dizziness, nv that started pta. Had a similar episode 1 month ago. He was given asa and ntg en route which improved sxs.   Review of Systems  Positive: Chest pain, dizziness, nv, epigastric pain Negative: sob  Physical Exam  BP 115/85 (BP Location: Right Arm)   Pulse 74   Temp 98.6 F (37 C) (Oral)   Resp 17   SpO2 96%  Gen:   Awake, no distress   Resp:  Normal effort  MSK:   Moves extremities without difficulty  Other:    Medical Decision Making  Medically screening exam initiated at 2:32 PM.  Appropriate orders placed.  Larue Lightner was informed that the remainder of the evaluation will be completed by another provider, this initial triage assessment does not replace that evaluation, and the importance of remaining in the ED until their evaluation is complete.     Rayne Du 02/01/21 1437    Eber Hong, MD 02/01/21 Windell Moment

## 2021-02-01 NOTE — ED Provider Notes (Signed)
[facility] EMERGENCY DEPARTMENT Provider Note   CSN: [id number] Arrival date & time: 02/01/21  1427     History Chief Complaint  Patient presents with   Chest Pain    Casey Yoder is a 55 y.o. male.   Chest Pain  This patient is a 55 year old male with a history of some PTSD and bipolar disorder.  He does not smoke or drink and states that he walks frequently has lost intentionally 35 pounds from his exercise and has been doing very well.  He works in a Programme researcher, broadcasting/film/video, he was indoors this morning doing some of his work, nothing out of the ordinary when he developed a feeling of lightheadedness and dizziness.  He had a discomfort in his chest which was mild.  He decided to leave work and because he continued to have the same feeling he ended up calling the paramedics.  He was given aspirin and nitroglycerin and ultimately felt much better.  He still feels a slight twinge in his chest and still feels a little bit lightheaded.  He has not had any nausea vomiting or diarrhea, he feels a little bit shaky.  He has never had any cardiac disease.  He does recall having a similar episode 1 month ago but did not proceed to follow-up and get evaluation.  The patient denies fevers chills coughing or shortness of breath.  He has not been exposed to anybody who has been sick but he has had multiple tick bites over the month.  Past Medical History:  Diagnosis Date   Bipolar 1 disorder (HCC)    Chest pain    Obesity    OBSTRUCTIVE SLEEP APNEA 10/26/2009   Qualifier: Diagnosis of  By: Shelle Iron MD, Maree Krabbe     Patient Active Problem List   Diagnosis Date Noted   Polyuria 02/13/2017   Acute encephalopathy 02/12/2017   Chest pain    Obesity    Bipolar 1 disorder (HCC)    OBSTRUCTIVE SLEEP APNEA 10/26/2009     The histories are not reviewed yet. Please review them in the "History" navigator section and refresh this SmartLink.     Family History  Problem Relation  Age of Onset   Heart disease Maternal Grandfather 9    Social History   Tobacco Use   Smoking status: Never   Smokeless tobacco: Never  Vaping Use   Vaping Use: Never used  Substance Use Topics   Alcohol use: No   Drug use: No    Home Medications Prior to Admission medications   Medication Sig Start Date End Date Taking? Authorizing Provider  doxycycline (VIBRAMYCIN) 100 MG capsule Take 1 capsule (100 mg total) by mouth 2 (two) times daily. 02/01/21  Yes Eber Hong, MD  MAGNESIUM PO Take 1 tablet by mouth every evening.    [provider]  naproxen sodium (ANAPROX) 220 MG tablet Take 220 mg by mouth 2 (two) times daily as needed (pain).    [provider]  POTASSIUM PO Take 1 tablet by mouth daily.    [provider]    Allergies    Patient has no known allergies.  Review of Systems   Review of Systems  Cardiovascular:  Positive for chest pain.  All other systems reviewed and are negative.  Physical Exam Updated Vital Signs BP 109/87   Pulse (!) 57   Temp 98.6 F (37 C) (Oral)   Resp 16   SpO2 100%   Physical Exam Vitals and  nursing note reviewed.  Constitutional:      General: He is not in acute distress.    Appearance: He is well-developed.  HENT:     Head: Normocephalic and atraumatic.     Mouth/Throat:     Pharynx: No oropharyngeal exudate.  Eyes:     General: No scleral icterus.       Right eye: No discharge.        Left eye: No discharge.     Conjunctiva/sclera: Conjunctivae normal.     Pupils: Pupils are equal, round, and reactive to light.  Neck:     Thyroid: No thyromegaly.     Vascular: No JVD.  Cardiovascular:     Rate and Rhythm: Normal rate and regular rhythm.     Heart sounds: Normal heart sounds. No murmur heard.   No friction rub. No gallop.  Pulmonary:     Effort: Pulmonary effort is normal. No respiratory distress.     Breath sounds: Normal breath sounds. No wheezing or rales.  Abdominal:     General:  Bowel sounds are normal. There is no distension.     Palpations: Abdomen is soft. There is no mass.     Tenderness: There is no abdominal tenderness.  Musculoskeletal:        General: No tenderness. Normal range of motion.     Cervical back: Normal range of motion and neck supple.  Lymphadenopathy:     Cervical: No cervical adenopathy.  Skin:    General: Skin is warm and dry.     Findings: No erythema or rash.  Neurological:     Mental Status: He is alert.     Coordination: Coordination normal.  Psychiatric:        Behavior: Behavior normal.    ED Results / Procedures / Treatments   Labs (all labs ordered are listed, but only abnormal results are displayed) Labs Reviewed  COMPREHENSIVE METABOLIC PANEL - Abnormal; Notable for the following components:      Result Value   Creatinine, Ser 1.32 (*)    All other components within normal limits  LIPASE, BLOOD  CBC WITH DIFFERENTIAL/PLATELET  TROPONIN I (HIGH SENSITIVITY)  TROPONIN I (HIGH SENSITIVITY)    EKG EKG Interpretation  Date/Time:  Friday February 01 2021 14:28:39 EDT Ventricular Rate:  71 PR Interval:  164 QRS Duration: 70 QT Interval:  350 QTC Calculation: 380 R Axis:   33 Text Interpretation: Normal sinus rhythm with sinus arrhythmia Normal ECG Confirmed by Eber Hong ([id number]) on 02/01/2021 6:27:44 PM  Radiology DG Chest 2 View  Result Date: 02/01/2021 CLINICAL DATA:  Chest pain EXAM: CHEST - 2 VIEW COMPARISON:  02/12/2017 FINDINGS: The heart size and mediastinal contours are within normal limits. Both lungs are clear. The visualized skeletal structures are unremarkable. IMPRESSION: No active cardiopulmonary disease. Electronically Signed   By: Alcide Clever M.D.   On: 02/01/2021 15:28    Procedures Procedures   Medications Ordered in ED Medications - No data to display  ED Course  I have reviewed the triage vital signs and the nursing notes.  Pertinent labs & imaging results that were available during my  care of the patient were reviewed by me and considered in my medical decision making (see chart for details).    MDM Rules/Calculators/A&P                          I have reviewed the patient's history at length, he has had  plenty of exertional episodes over the last month where he has absolutely no symptoms.  He exercises regularly without any discomfort in his chest.  This came on spontaneously today and is associated with a totally normal EKG a totally unremarkable troponin and labs.  His x-ray is negative, he continues to have occasional tremor and occasional discomfort in his chest but I doubt that this is cardiac in nature.  He does not smoke or drink and has very low risk for cardiac disease.  We will treat for potential tickborne illness and he can be discharged, he is agreeable to the plan and will return as needed.  Final Clinical Impression(s) / ED Diagnoses Final diagnoses:  Chest pain, unspecified type  Dizziness    Rx / DC Orders ED Discharge Orders          Ordered    doxycycline (VIBRAMYCIN) 100 MG capsule  2 times daily        02/01/21 1913             Eber Hong, MD 02/01/21 1914

## 2021-02-01 NOTE — Discharge Instructions (Addendum)
Your testing came back looking normal  Please return to the emergency department for any severe or worsening symptoms.  Doxycycline 100mg  by mouth twice daily until the medicine is completely finished - this medicine is a strong antibiotic that treats certain infections.

## 2021-05-14 NOTE — Progress Notes (Signed)
Referring-James Kathrynn Running, MD Reason for referral-chest pain  HPI: 55 year old male for evaluation of chest pain at request of Aleatha Borer, MD.  Patient seen with chest pain June 2022.  Chest x-ray without acute infiltrate.  Troponin I normal.  Hemoglobin 14.2, potassium 4.0, creatinine 1.32, liver functions normal and lipase 30.  Patient has lost significant amounts of weight recently (80 pounds in the past 3 to 5 months).  He has had occasional chest pain.  He takes 1 finger and places it in the left chest area.  Occasional pain in the neck as well.  It is not clearly exertional.  Lasts 15 minutes to 30 minutes and resolve spontaneously.  Afterwards he does have nausea but no dyspnea or diaphoresis.  He also has dizziness not associated with palpitations.  These are worse with changing positions (standing from the lying position or bending over and coming back up).  He has not had frank syncope.  He did have an episode while driving where he felt dizzy and pulled over.  Cardiology now asked to evaluate.  Current Outpatient Medications  Medication Sig Dispense Refill   buPROPion (WELLBUTRIN XL) 300 MG 24 hr tablet Take 300 mg by mouth in the morning.     ergocalciferol (VITAMIN D2) 1.25 MG (50000 UT) capsule Take 50,000 Units by mouth once a week.     LAMOTRIGINE PO Take 20 mg by mouth at bedtime.     lurasidone (LATUDA) 40 MG TABS tablet Take 20 mg by mouth at bedtime.     MAGNESIUM PO Take 1 tablet by mouth every evening.     modafinil (PROVIGIL) 200 MG tablet Take 200 mg by mouth daily.     sildenafil (VIAGRA) 100 MG tablet TAKE ONE TABLET BY MOUTH AS DIRECTED (TAKE 1 HOUR PRIOR TO SEXUAL ACTIVITY *DO NOT EXCEED 1 DOSE PER 24 HOUR PERIOD*)     vitamin B-12 (CYANOCOBALAMIN) 500 MCG tablet Take 1 tablet by mouth daily.     doxycycline (VIBRAMYCIN) 100 MG capsule Take 1 capsule (100 mg total) by mouth 2 (two) times daily. (Patient not taking: Reported on 05/16/2021) 20 capsule 0   LORazepam  (ATIVAN) 0.5 MG tablet Take 0.5 mg by mouth as needed. (Patient not taking: Reported on 05/16/2021)     naproxen sodium (ANAPROX) 220 MG tablet Take 220 mg by mouth 2 (two) times daily as needed (pain). (Patient not taking: Reported on 05/16/2021)     POTASSIUM PO Take 1 tablet by mouth daily. (Patient not taking: Reported on 05/16/2021)     No current facility-administered medications for this visit.    No Known Allergies   Past Medical History:  Diagnosis Date   Bipolar 1 disorder (HCC)    Chest pain    Obesity    OBSTRUCTIVE SLEEP APNEA 10/26/2009   Qualifier: Diagnosis of  By: Shelle Iron MD, Maree Krabbe    PTSD (post-traumatic stress disorder)     Past Surgical History:  Procedure Laterality Date   KNEE SURGERY  08/25/1984   WRIST SURGERY      Social History   Socioeconomic History   Marital status: Married    Spouse name: Not on file   Number of children: 1   Years of education: Not on file   Highest education level: Not on file  Occupational History   Not on file  Tobacco Use   Smoking status: Never   Smokeless tobacco: Never  Vaping Use   Vaping Use: Never used  Substance and Sexual  Activity   Alcohol use: No   Drug use: No   Sexual activity: Yes    Partners: Female  Other Topics Concern   Not on file  Social History Narrative   Not on file   Social Determinants of Health   Financial Resource Strain: Not on file  Food Insecurity: Not on file  Transportation Needs: Not on file  Physical Activity: Not on file  Stress: Not on file  Social Connections: Not on file  Intimate Partner Violence: Not on file    Family History  Problem Relation Age of Onset   Hypertension Mother    Heart disease Maternal Grandfather 50    ROS: no fevers or chills, productive cough, hemoptysis, dysphasia, odynophagia, melena, hematochezia, dysuria, hematuria, rash, seizure activity, orthopnea, PND, pedal edema, claudication. Remaining systems are negative.  Physical Exam:    Blood pressure 102/78, pulse 72, height 5\' 7"  (1.702 m), weight 184 lb 3.2 oz (83.6 kg), SpO2 96 %.  General:  Well developed/well nourished in NAD Skin warm/dry Patient not depressed No peripheral clubbing Back-normal HEENT-normal/normal eyelids Neck supple/normal carotid upstroke bilaterally; no bruits; no JVD; no thyromegaly chest - CTA/ normal expansion CV - RRR/normal S1 and S2; no murmurs, rubs or gallops;  PMI nondisplaced Abdomen -NT/ND, no HSM, no mass, + bowel sounds, no bruit 2+ femoral pulses, no bruits Ext-no edema, chords, 2+ DP Neuro-grossly nonfocal  ECG -February 01, 2021-normal sinus rhythm with no ST changes.  Personally reviewed  Today's electrocardiogram shows sinus bradycardia at a rate of 57 and no ST changes; personally reviewed.  A/P  1 chest pain-symptoms are atypical.  I will arrange a cardiac CTA to rule out obstructive coronary disease.  2 presyncope-symptoms sound mostly to be orthostatic mediated and he has lost significant amounts of weight recently.  I discussed the importance of hydration.  We also discussed increasing sodium intake.  I will arrange an echocardiogram to assess LV function.  If symptoms persist and above evaluation unrevealing then he may need a monitor as well.  3 obstructive sleep apnea-continue CPAP.  February 03, 2021, MD

## 2021-05-16 ENCOUNTER — Ambulatory Visit (INDEPENDENT_AMBULATORY_CARE_PROVIDER_SITE_OTHER): Payer: BC Managed Care – PPO | Admitting: Cardiology

## 2021-05-16 ENCOUNTER — Encounter: Payer: Self-pay | Admitting: Cardiology

## 2021-05-16 ENCOUNTER — Other Ambulatory Visit: Payer: Self-pay

## 2021-05-16 VITALS — BP 102/78 | HR 72 | Ht 67.0 in | Wt 184.2 lb

## 2021-05-16 DIAGNOSIS — R55 Syncope and collapse: Secondary | ICD-10-CM

## 2021-05-16 DIAGNOSIS — G4733 Obstructive sleep apnea (adult) (pediatric): Secondary | ICD-10-CM

## 2021-05-16 DIAGNOSIS — R072 Precordial pain: Secondary | ICD-10-CM

## 2021-05-16 LAB — BASIC METABOLIC PANEL
BUN/Creatinine Ratio: 6 — ABNORMAL LOW (ref 9–20)
BUN: 6 mg/dL (ref 6–24)
CO2: 26 mmol/L (ref 20–29)
Calcium: 10.2 mg/dL (ref 8.7–10.2)
Chloride: 104 mmol/L (ref 96–106)
Creatinine, Ser: 1.05 mg/dL (ref 0.76–1.27)
Glucose: 93 mg/dL (ref 65–99)
Potassium: 5.4 mmol/L — ABNORMAL HIGH (ref 3.5–5.2)
Sodium: 143 mmol/L (ref 134–144)
eGFR: 84 mL/min/{1.73_m2} (ref >59)

## 2021-05-16 MED ORDER — METOPROLOL TARTRATE 25 MG PO TABS: ORAL_TABLET | ORAL | 0 refills | Status: DC

## 2021-05-16 NOTE — Patient Instructions (Signed)
Testing/Procedures:  Your physician has requested that you have an echocardiogram. Echocardiography is a painless test that uses sound waves to create images of your heart. It provides your doctor with information about the size and shape of your heart and how well your heart's chambers and valves are working. This procedure takes approximately one hour. There are no restrictions for this procedure. [address]   Your cardiac CT will be scheduled at  [facility] [address] [place], [place] [address] [phone]   If scheduled at [facility], please arrive at the [facility] main entrance (entrance A) of [facility] 30 minutes prior to test start time. Proceed to the [facility] Radiology Department (first floor) to check-in and test prep.   Please follow these instructions carefully (unless otherwise directed):  Hold all erectile dysfunction medications at least 3 days (72 hrs) prior to test.  On the Night Before the Test: Be sure to Drink plenty of water. Do not consume any caffeinated/decaffeinated beverages or chocolate 12 hours prior to your test. Do not take any antihistamines 12 hours prior to your test.   On the Day of the Test: Drink plenty of water until 1 hour prior to the test. Do not eat any food 4 hours prior to the test. You may take your regular medications prior to the test.  Take metoprolol (Lopressor) 25 MG two hours prior to test. HOLD Furosemide/Hydrochlorothiazide morning of the test.      After the Test: Drink plenty of water. After receiving IV contrast, you may experience a mild flushed feeling. This is normal. On occasion, you may experience a mild rash up to 24 hours after the test. This is not dangerous. If this occurs, you can take Benadryl 25 mg and increase your fluid intake. If you experience trouble breathing, this can be serious. If it is severe call 911 IMMEDIATELY. If it is mild, please call our  office. If you take any of these medications: Glipizide/Metformin, Avandament, Glucavance, please do not take 48 hours after completing test unless otherwise instructed.  Please allow 2-4 weeks for scheduling of routine cardiac CTs. Some insurance companies require a pre-authorization which may delay scheduling of this test.   For non-scheduling related questions, please contact the cardiac imaging nurse navigator should you have any questions/concerns: Rockwell Alexandria, Cardiac Imaging Nurse Navigator Larey Brick, Cardiac Imaging Nurse Navigator [facility] Direct Office Dial: [phone]   For scheduling needs, including cancellations and rescheduling, please call [place], [phone].    Follow-Up: At [facility], you and your health needs are our priority.  As part of our continuing mission to provide you with exceptional heart care, we have created designated Provider Care Teams.  These Care Teams include your primary Cardiologist (physician) and Advanced Practice Providers (APPs -  Physician Assistants and Nurse Practitioners) who all work together to provide you with the care you need, when you need it.  We recommend signing up for the patient portal called "MyChart".  Sign up information is provided on this After Visit Summary.  MyChart is used to connect with patients for Virtual Visits (Telemedicine).  Patients are able to view lab/test results, encounter notes, upcoming appointments, etc.  Non-urgent messages can be sent to your provider as well.   To learn more about what you can do with MyChart, go to [email or web address].    Your next appointment:   8 week(s)  The format for your next appointment:  In Person  Provider:   Kirk Ruths, MD

## 2021-05-17 ENCOUNTER — Other Ambulatory Visit: Payer: Self-pay | Admitting: *Deleted

## 2021-05-17 DIAGNOSIS — E875 Hyperkalemia: Secondary | ICD-10-CM

## 2021-05-20 NOTE — Progress Notes (Signed)
Pt CCTA moved to tomorrow 9/27 at 11:00a per request  Reaching out to patient to offer assistance regarding upcoming cardiac imaging study; pt verbalizes understanding of appt date/time, parking situation and where to check in, pre-test NPO status and medications ordered, and verified current allergies; name and call back number provided for further questions should they arise Rockwell Alexandria RN Navigator Cardiac Imaging [facility] [phone] office [phone] cell  25mg  metop 2 hr prior to scan

## 2021-05-21 ENCOUNTER — Other Ambulatory Visit: Payer: Self-pay

## 2021-05-21 ENCOUNTER — Ambulatory Visit (HOSPITAL_COMMUNITY)
Admission: RE | Admit: 2021-05-21 | Discharge: 2021-05-21 | Disposition: A | Discharge status: Home / Self Care | Payer: BC Managed Care – PPO | Source: Ambulatory Visit | Attending: Cardiology | Admitting: Cardiology

## 2021-05-21 DIAGNOSIS — R072 Precordial pain: Secondary | ICD-10-CM | POA: Diagnosis present

## 2021-05-21 MED ORDER — NITROGLYCERIN 0.4 MG SL SUBL
SUBLINGUAL_TABLET | SUBLINGUAL | Status: AC
  Filled 2021-05-21: qty 2

## 2021-05-21 MED ORDER — NITROGLYCERIN 0.4 MG SL SUBL
0.8000 mg | SUBLINGUAL_TABLET | Freq: Once | SUBLINGUAL | Status: AC
  Administered 2021-05-21: 0.8 mg via SUBLINGUAL

## 2021-05-21 MED ORDER — IOHEXOL 350 MG/ML SOLN
95.0000 mL | Freq: Once | INTRAVENOUS | Status: AC | PRN
  Administered 2021-05-21: 95 mL via INTRAVENOUS

## 2021-05-22 ENCOUNTER — Ambulatory Visit (HOSPITAL_COMMUNITY): Admission: RE | Admit: 2021-05-22 | Payer: BC Managed Care – PPO | Source: Ambulatory Visit

## 2021-05-29 ENCOUNTER — Encounter (HOSPITAL_BASED_OUTPATIENT_CLINIC_OR_DEPARTMENT_OTHER): Payer: BC Managed Care – PPO | Admitting: Physician Assistant

## 2021-06-04 ENCOUNTER — Ambulatory Visit (HOSPITAL_COMMUNITY): Payer: BC Managed Care – PPO | Attending: Cardiovascular Disease

## 2021-06-04 ENCOUNTER — Other Ambulatory Visit: Payer: Self-pay

## 2021-06-04 DIAGNOSIS — R072 Precordial pain: Secondary | ICD-10-CM | POA: Insufficient documentation

## 2021-06-04 LAB — ECHOCARDIOGRAM COMPLETE
Area-P 1/2: 3.29 cm2
S' Lateral: 2.9 cm

## 2021-06-10 ENCOUNTER — Encounter: Payer: Self-pay | Admitting: *Deleted

## 2021-07-10 NOTE — Progress Notes (Deleted)
HPI: FU CP.  Patient seen with chest pain June 2022.  Chest x-ray without acute infiltrate.  Troponin I normal.  Hemoglobin 14.2, potassium 4.0, creatinine 1.32, liver functions normal and lipase 30.  Echocardiogram October 2022 showed normal LV function.  Coronary CTA September 2022 showed calcium score 0 and no coronary disease.  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  There was note of subpleural pulmonary nodules and follow-up recommended in 12 months.  Since last seen  Current Outpatient Medications  Medication Sig Dispense Refill   buPROPion (WELLBUTRIN XL) 300 MG 24 hr tablet Take 300 mg by mouth in the morning.     doxycycline (VIBRAMYCIN) 100 MG capsule Take 1 capsule (100 mg total) by mouth 2 (two) times daily. (Patient not taking: Reported on 05/16/2021) 20 capsule 0   ergocalciferol (VITAMIN D2) 1.25 MG (50000 UT) capsule Take 50,000 Units by mouth once a week.     LAMOTRIGINE PO Take 20 mg by mouth at bedtime.     LORazepam (ATIVAN) 0.5 MG tablet Take 0.5 mg by mouth as needed. (Patient not taking: Reported on 05/16/2021)     lurasidone (LATUDA) 40 MG TABS tablet Take 20 mg by mouth at bedtime.     MAGNESIUM PO Take 1 tablet by mouth every evening.     metoprolol tartrate (LOPRESSOR) 25 MG tablet TAKE 2 HOURS PRIOR TO CT SCAN 1 tablet 0   modafinil (PROVIGIL) 200 MG tablet Take 200 mg by mouth daily.     naproxen sodium (ANAPROX) 220 MG tablet Take 220 mg by mouth 2 (two) times daily as needed (pain). (Patient not taking: Reported on 05/16/2021)     POTASSIUM PO Take 1 tablet by mouth daily. (Patient not taking: Reported on 05/16/2021)     sildenafil (VIAGRA) 100 MG tablet TAKE ONE TABLET BY MOUTH AS DIRECTED (TAKE 1 HOUR PRIOR TO SEXUAL ACTIVITY *DO NOT EXCEED 1 DOSE PER 24 HOUR PERIOD*)     vitamin B-12 (CYANOCOBALAMIN) 500 MCG tablet Take 1 tablet by mouth daily.     No current facility-administered medications for this visit.     Past Medical History:  Diagnosis Date    Bipolar 1 disorder (HCC)    Chest pain    Obesity    OBSTRUCTIVE SLEEP APNEA 10/26/2009   Qualifier: Diagnosis of  By: Shelle Iron MD, Maree Krabbe    PTSD (post-traumatic stress disorder)     Past Surgical History:  Procedure Laterality Date   KNEE SURGERY  08/25/1984   WRIST SURGERY      Social History   Socioeconomic History   Marital status: Married    Spouse name: Not on file   Number of children: 1   Years of education: Not on file   Highest education level: Not on file  Occupational History   Not on file  Tobacco Use   Smoking status: Never   Smokeless tobacco: Never  Vaping Use   Vaping Use: Never used  Substance and Sexual Activity   Alcohol use: No   Drug use: No   Sexual activity: Yes    Partners: Female  Other Topics Concern   Not on file  Social History Narrative   Not on file   Social Determinants of Health   Financial Resource Strain: Not on file  Food Insecurity: Not on file  Transportation Needs: Not on file  Physical Activity: Not on file  Stress: Not on file  Social Connections: Not on file  Intimate Partner Violence:  Not on file    Family History  Problem Relation Age of Onset   Hypertension Mother    Heart disease Maternal Grandfather 19    ROS: no fevers or chills, productive cough, hemoptysis, dysphasia, odynophagia, melena, hematochezia, dysuria, hematuria, rash, seizure activity, orthopnea, PND, pedal edema, claudication. Remaining systems are negative.  Physical Exam: Well-developed well-nourished in no acute distress.  Skin is warm and dry.  HEENT is normal.  Neck is supple.  Chest is clear to auscultation with normal expansion.  Cardiovascular exam is regular rate and rhythm.  Abdominal exam nontender or distended. No masses palpated. Extremities show no edema. neuro grossly intact  ECG- personally reviewed  A/P  1 chest pain-previous symptoms atypical.  Cardiac CTA showed no obstructive coronary disease and LV function normal  on echocardiogram.  Would not pursue further evaluation.  2 pulmonary nodule-plan follow-up noncontrast chest CT September 2023.  3 history of presyncope-felt secondary to orthostasis.  Continue hydration and increase sodium intake.  4 obstructive sleep apnea-continue CPAP.  Kirk Ruths, MD

## 2021-07-22 ENCOUNTER — Ambulatory Visit: Payer: No Typology Code available for payment source | Admitting: Cardiology

## 2022-05-16 ENCOUNTER — Emergency Department (HOSPITAL_COMMUNITY): Payer: No Typology Code available for payment source

## 2022-05-16 ENCOUNTER — Inpatient Hospital Stay (HOSPITAL_COMMUNITY)
Admission: EM | Admit: 2022-05-16 | Discharge: 2022-05-18 | DRG: 684 | Disposition: A | Discharge status: Home / Self Care | Payer: No Typology Code available for payment source | Attending: Family Medicine | Admitting: Family Medicine

## 2022-05-16 ENCOUNTER — Encounter (HOSPITAL_COMMUNITY): Payer: Self-pay

## 2022-05-16 ENCOUNTER — Other Ambulatory Visit: Payer: Self-pay

## 2022-05-16 DIAGNOSIS — F319 Bipolar disorder, unspecified: Secondary | ICD-10-CM | POA: Diagnosis present

## 2022-05-16 DIAGNOSIS — K5900 Constipation, unspecified: Secondary | ICD-10-CM | POA: Diagnosis not present

## 2022-05-16 DIAGNOSIS — I10 Essential (primary) hypertension: Secondary | ICD-10-CM | POA: Diagnosis present

## 2022-05-16 DIAGNOSIS — N400 Enlarged prostate without lower urinary tract symptoms: Secondary | ICD-10-CM

## 2022-05-16 DIAGNOSIS — D638 Anemia in other chronic diseases classified elsewhere: Secondary | ICD-10-CM | POA: Diagnosis present

## 2022-05-16 DIAGNOSIS — N401 Enlarged prostate with lower urinary tract symptoms: Secondary | ICD-10-CM | POA: Diagnosis present

## 2022-05-16 DIAGNOSIS — Z79899 Other long term (current) drug therapy: Secondary | ICD-10-CM

## 2022-05-16 DIAGNOSIS — Z87442 Personal history of urinary calculi: Secondary | ICD-10-CM

## 2022-05-16 DIAGNOSIS — G4733 Obstructive sleep apnea (adult) (pediatric): Secondary | ICD-10-CM | POA: Diagnosis not present

## 2022-05-16 DIAGNOSIS — R350 Frequency of micturition: Secondary | ICD-10-CM | POA: Diagnosis present

## 2022-05-16 DIAGNOSIS — F4310 Post-traumatic stress disorder, unspecified: Secondary | ICD-10-CM | POA: Diagnosis present

## 2022-05-16 DIAGNOSIS — N179 Acute kidney failure, unspecified: Secondary | ICD-10-CM | POA: Diagnosis not present

## 2022-05-16 DIAGNOSIS — Z8249 Family history of ischemic heart disease and other diseases of the circulatory system: Secondary | ICD-10-CM

## 2022-05-16 LAB — URINALYSIS, COMPLETE (UACMP) WITH MICROSCOPIC
Bacteria, UA: NONE SEEN
Bilirubin Urine: NEGATIVE
Glucose, UA: NEGATIVE mg/dL
Ketones, ur: NEGATIVE mg/dL
Leukocytes,Ua: NEGATIVE
Nitrite: NEGATIVE
Protein, ur: NEGATIVE mg/dL
Specific Gravity, Urine: 1.005 (ref 1.005–1.030)
pH: 7 (ref 5.0–8.0)

## 2022-05-16 LAB — CBC WITH DIFFERENTIAL/PLATELET
Abs Immature Granulocytes: 0.02 10*3/uL (ref 0.00–0.07)
Basophils Absolute: 0 10*3/uL (ref 0.0–0.1)
Basophils Relative: 0 %
Eosinophils Absolute: 0.3 10*3/uL (ref 0.0–0.5)
Eosinophils Relative: 5 %
HCT: 33.3 % — ABNORMAL LOW (ref 39.0–52.0)
Hemoglobin: 11.1 g/dL — ABNORMAL LOW (ref 13.0–17.0)
Immature Granulocytes: 0 %
Lymphocytes Relative: 18 %
Lymphs Abs: 1.2 10*3/uL (ref 0.7–4.0)
MCH: 31.4 pg (ref 26.0–34.0)
MCHC: 33.3 g/dL (ref 30.0–36.0)
MCV: 94.1 fL (ref 80.0–100.0)
Monocytes Absolute: 0.7 10*3/uL (ref 0.1–1.0)
Monocytes Relative: 11 %
Neutro Abs: 4.5 10*3/uL (ref 1.7–7.7)
Neutrophils Relative %: 66 %
Platelets: 243 10*3/uL (ref 150–400)
RBC: 3.54 MIL/uL — ABNORMAL LOW (ref 4.22–5.81)
RDW: 11.7 % (ref 11.5–15.5)
WBC: 6.8 10*3/uL (ref 4.0–10.5)
nRBC: 0 % (ref 0.0–0.2)

## 2022-05-16 LAB — PSA: Prostatic Specific Antigen: 0.9 ng/mL (ref 0.00–4.00)

## 2022-05-16 LAB — COMPREHENSIVE METABOLIC PANEL
ALT: 31 U/L (ref 0–44)
AST: 25 U/L (ref 15–41)
Albumin: 3.6 g/dL (ref 3.5–5.0)
Alkaline Phosphatase: 123 U/L (ref 38–126)
Anion gap: 3 — ABNORMAL LOW (ref 5–15)
BUN: 39 mg/dL — ABNORMAL HIGH (ref 6–20)
CO2: 26 mmol/L (ref 22–32)
Calcium: 12.1 mg/dL — ABNORMAL HIGH (ref 8.9–10.3)
Chloride: 109 mmol/L (ref 98–111)
Creatinine, Ser: 3.82 mg/dL — ABNORMAL HIGH (ref 0.61–1.24)
GFR, Estimated: 18 mL/min — ABNORMAL LOW (ref >60)
Glucose, Bld: 90 mg/dL (ref 70–99)
Potassium: 4.4 mmol/L (ref 3.5–5.1)
Sodium: 138 mmol/L (ref 135–145)
Total Bilirubin: 0.5 mg/dL (ref 0.3–1.2)
Total Protein: 7 g/dL (ref 6.5–8.1)

## 2022-05-16 LAB — VITAMIN D 25 HYDROXY (VIT D DEFICIENCY, FRACTURES): Vit D, 25-Hydroxy: 55.91 ng/mL (ref 30–100)

## 2022-05-16 MED ORDER — SODIUM CHLORIDE 0.9 % IV SOLN: INTRAVENOUS | Status: DC

## 2022-05-16 MED ORDER — METOPROLOL TARTRATE 25 MG PO TABS: 25.0000 mg | ORAL_TABLET | Freq: Two times a day (BID) | ORAL | Status: DC

## 2022-05-16 MED ORDER — ENOXAPARIN SODIUM 30 MG/0.3ML IJ SOSY: 30.0000 mg | PREFILLED_SYRINGE | INTRAMUSCULAR | Status: DC

## 2022-05-16 MED ORDER — LURASIDONE HCL 20 MG PO TABS
20.0000 mg | ORAL_TABLET | Freq: Every day | ORAL | Status: DC
  Administered 2022-05-16 – 2022-05-17 (×2): 20 mg via ORAL
  Filled 2022-05-16 (×5): qty 1

## 2022-05-16 MED ORDER — ENOXAPARIN SODIUM 30 MG/0.3ML IJ SOSY
30.0000 mg | PREFILLED_SYRINGE | INTRAMUSCULAR | Status: DC
  Administered 2022-05-17: 30 mg via SUBCUTANEOUS
  Filled 2022-05-16: qty 0.3

## 2022-05-16 MED ORDER — TAMSULOSIN HCL 0.4 MG PO CAPS
0.4000 mg | ORAL_CAPSULE | Freq: Every day | ORAL | Status: DC
  Administered 2022-05-17: 0.4 mg via ORAL
  Filled 2022-05-16: qty 1

## 2022-05-16 MED ORDER — LAMOTRIGINE 25 MG PO TABS
25.0000 mg | ORAL_TABLET | Freq: Every day | ORAL | Status: DC
  Administered 2022-05-16 – 2022-05-17 (×2): 25 mg via ORAL
  Filled 2022-05-16 (×2): qty 1

## 2022-05-16 MED ORDER — SODIUM CHLORIDE 0.9 % IV BOLUS
1000.0000 mL | Freq: Once | INTRAVENOUS | Status: AC
  Administered 2022-05-16: 1000 mL via INTRAVENOUS

## 2022-05-16 NOTE — H&P (Signed)
Hospital Admission History and Physical Service Pager: [phone]  Patient name: Casey Yoder Medical record number: [id number] Date of Birth: [birth year] Age: 56 y.o. Gender: male  Primary Care Provider: Arlan Organ, MD Consultants: Nephro Code Status: FULL which was confirmed with family Preferred Emergency Contact:  Contact Information     Name Relation Home Work Mobile   Lou­za Spouse [phone]          Chief Complaint: Hypercalcemia, AKI   Assessment and Plan: Casey Yoder is a 56 y.o. male presenting with hypercalcemia and AKI. Differential for this patient's presentation of this includes concern for malignancy, parathyroid mediated, vitamin D intoxication, chronic granulomatous disorder (very unlikely), medications (unlikely given lack of nephrotoxic medication use regularly), adrenal insufficiency (unlikely without electrolyte disarray), hyperthyroidism.   * Hypercalcemia Ca 10>12.1 today. Has not been feeling well for the past 6-9 months. See above for differential.  Reported intentional weight loss 100 lbs in one year. CXR negative. EKG sinus rhythm.  - admit to [facility], Attending Dr. Manson Passey  - med/tele  - continuous cardiac monitoring  - Nephrology consulted, appreciate recommendations  - PTH, PTH related protein, RFP ordered 9/23 - renal U/S ordered  - UA  - mIVF 56mL/hr for now  - Monitor I/Os  AKI (acute kidney injury) (HCC) Suspected prerenal AKI, although unsure. Cr 3.82, GFR 18. Historical BMP from 2022: Cr 1.05, GFR >60.  - IV fluid hydration given in the ED  - Continue mIVF - Strict I/Os - renal dosing of medications  - Avoid nephrotoxic medications   Bipolar 1 disorder (HCC) Continue home Latuda and Lamictal, do not need renal dosing   OBSTRUCTIVE SLEEP APNEA Intermittent use of CPAP, but has not needed this with weight loss.        FEN/GI: Renal diet  VTE Prophylaxis: Renally dosed lovenox   Disposition: Med tele    History of Present Illness:  Casey Yoder is a 56 y.o. male presenting with onset of fatigue and lack of energy about 8-9 months. He has been feeling weakness over his entire body.  These vague symptoms seem to have been worsening over the past couple of months.  [facility] but he reports that there were no abnormal results at that time.  March-September 2022, he tried losing weight and was put on modafinil.  Patient reports that he intentionally lost 100 pounds and was able to maintain his weight up until this month.  He reports that since January he has lost a total of 6 pounds.  Saw PCP yesterday and got blood work.  He notes his PCP wanted him to come into the emergency room given his lab results.  Never had kidney injury in the past.    Uses CPAP intermittently at night for OSA but since losing weight has not needed this.  Patient denies B symptoms, chest pain, shortness of breath, swelling of the extremities.  Patient does report that he is urinating more frequently throughout the night and during the day, has been checked for diabetes and was euglycemic.  Colonoscopy in the last year that was normal.   In the ED, Received a 1L bolus of NS and is on 4mL/hr mIVF now. Seen by nephrology prior to our admission.  Continue with renal ultrasound and UA, also continuing with orthostatics, PTH, PTH like peptide, vitamin D, ionized calcium.   Review Of Systems: Per HPI with the following additions: See above.   Pertinent Past Medical History: Past Medical History:  Diagnosis Date   Bipolar 1 disorder ([place])    Chest pain    Obesity    OBSTRUCTIVE SLEEP APNEA 10/26/2009   Qualifier: Diagnosis of  By: Gwenette Greet MD, Armando Reichert    PTSD (post-traumatic stress disorder)    Remainder reviewed in history tab.   Pertinent Past Surgical History: Past Surgical History:  Procedure Laterality Date   KNEE SURGERY  08/25/1984   WRIST SURGERY       Remainder reviewed in history  tab.  Pertinent Social History: Tobacco use: Yes/No/Former Alcohol use: None  Other Substance use: None  Lives with wife   Pertinent Family History: Family History  Problem Relation Age of Onset   Hypertension Mother    Heart disease Maternal Grandfather 1     Remainder reviewed in history tab.   Important Outpatient Medications: Modafinil 50 mg > stopped this about 2-3 days ago  Welbutrin 375 mg > took today   Flomax > took today  Latuda 20 mg  Lamictal 25 mg  Remainder reviewed in medication history.   Objective: BP 132/86   Pulse 79   Temp 98.2 F (36.8 C) (Oral)   Resp 20   Ht 5\' 7"  (1.702 m)   Wt 78.9 kg   SpO2 98%   BMI 27.25 kg/m  Exam: General: NAD, resting in bed with wife at bedside Eyes: Tracking well, PERRLA ENTM: No obvious abnormalities  Neck: No Cervical LAD  Cardiovascular: RR without m/g/r  Respiratory: CTAB  Gastrointestinal: NTTP, BS normoactive, nondistended, soft MSK: Able to move all extremities  Derm: No rashes  Neuro: A&Ox3, no neuro deficits  Psych: Normal affect and mood   Labs:  CBC BMET  Recent Labs  Lab 05/16/22 1338  WBC 6.8  HGB 11.1*  HCT 33.3*  PLT 243   Recent Labs  Lab 05/16/22 1338  NA 138  K 4.4  CL 109  CO2 26  BUN 39*  CREATININE 3.82*  GLUCOSE 90  CALCIUM 12.1*     EKG: NSR    Imaging Studies Performed:  Imaging Study (ie. Chest x-ray) DG Chest Port 1 View  Result Date: 05/16/2022 CLINICAL DATA:  Acute renal insufficiency. EXAM: PORTABLE CHEST 1 VIEW COMPARISON:  Chest radiograph dated 02/01/2021. FINDINGS: The heart size and mediastinal contours are within normal limits. Both lungs are clear. The visualized skeletal structures are unremarkable. IMPRESSION: No active disease. Electronically Signed   By: Anner Crete M.D.   On: 05/16/2022 17:16      Erskine Emery, MD 05/16/2022, 6:20 PM PGY-2, [facility] pager: [phone], text pages welcome Secure chat group  [place]

## 2022-05-16 NOTE — ED Provider Notes (Signed)
[facility] EMERGENCY DEPARTMENT Provider Note   CSN: [id number] Arrival date & time: 05/16/22  1247     History  Chief Complaint  Patient presents with   Abnormal Lab    Casey Yoder is a 56 y.o. male.  Patient sent in by primary care provider.  [facility] and [facility] to have primary care doctor with [facility] family medicine.  Patient's wife works as a Buyer, retail and she had discussed the situation with Dr. Kathrene Bongo from nephrology.  Dr. Luanne Bras been involved with admitting orders.  Patient sent in for elevated calcium and elevated BUN.  Patient really without any symptoms but he says for the past 6 to 8 months he just has not felt well.  Over the past year he is lost a lot of weight but that was on purpose.  His weight has been stable for about 6 months.  Past medical history is significant for posttraumatic stress disorder bipolar disorder obstructive sleep apnea and chest pain.  Patient is not a tobacco.  Patient denies any abdominal pain.  And history of kidney stones.  Patient has had a persistent cough for several months work-up for that has been negative.       Home Medications Prior to Admission medications   Medication Sig Start Date End Date Taking? Authorizing Provider  buPROPion (WELLBUTRIN XL) 300 MG 24 hr tablet Take 300 mg by mouth in the morning. 01/09/21   [provider]  doxycycline (VIBRAMYCIN) 100 MG capsule Take 1 capsule (100 mg total) by mouth 2 (two) times daily. Patient not taking: Reported on 05/16/2021 02/01/21   Eber Hong, MD  ergocalciferol (VITAMIN D2) 1.25 MG (50000 UT) capsule Take 50,000 Units by mouth once a week.    [provider]  LAMOTRIGINE PO Take 20 mg by mouth at bedtime.    [provider]  LORazepam (ATIVAN) 0.5 MG tablet Take 0.5 mg by mouth as needed. Patient not taking: Reported on 05/16/2021 03/07/20   [provider]  lurasidone (LATUDA)  40 MG TABS tablet Take 20 mg by mouth at bedtime. 03/07/20   [provider]  MAGNESIUM PO Take 1 tablet by mouth every evening.    [provider]  metoprolol tartrate (LOPRESSOR) 25 MG tablet TAKE 2 HOURS PRIOR TO CT SCAN 05/16/21   Lewayne Bunting, MD  modafinil (PROVIGIL) 200 MG tablet Take 200 mg by mouth daily. 05/15/21   [provider]  naproxen sodium (ANAPROX) 220 MG tablet Take 220 mg by mouth 2 (two) times daily as needed (pain). Patient not taking: Reported on 05/16/2021    [provider]  POTASSIUM PO Take 1 tablet by mouth daily. Patient not taking: Reported on 05/16/2021    [provider]  sildenafil (VIAGRA) 100 MG tablet TAKE ONE TABLET BY MOUTH AS DIRECTED (TAKE 1 HOUR PRIOR TO SEXUAL ACTIVITY *DO NOT EXCEED 1 DOSE PER 24 HOUR PERIOD*) 04/05/21   [provider]  vitamin B-12 (CYANOCOBALAMIN) 500 MCG tablet Take 1 tablet by mouth daily. 01/04/20   [provider]      Allergies    Patient has no known allergies.    Review of Systems   Review of Systems  Constitutional:  Positive for fatigue. Negative for chills and fever.  HENT:  Negative for rhinorrhea and sore throat.   Eyes:  Negative for visual disturbance.  Respiratory:  Negative for cough and shortness of breath.   Cardiovascular:  Negative  for chest pain and leg swelling.  Gastrointestinal:  Negative for abdominal pain, diarrhea, nausea and vomiting.  Genitourinary:  Negative for dysuria.  Musculoskeletal:  Negative for back pain and neck pain.  Skin:  Negative for rash.  Neurological:  Negative for dizziness, light-headedness and headaches.  Hematological:  Does not bruise/bleed easily.  Psychiatric/Behavioral:  Negative for confusion.     Physical Exam Updated Vital Signs BP (!) 132/93 (BP Location: Right Arm)   Pulse 87   Temp 98.3 F (36.8 C) (Oral)   Resp 18   Ht 1.702 m (5\' 7" )   Wt 78.9 kg   SpO2 98%   BMI 27.25 kg/m  Physical  Exam Vitals and nursing note reviewed.  Constitutional:      General: He is not in acute distress.    Appearance: Normal appearance. He is well-developed.  HENT:     Head: Normocephalic and atraumatic.  Eyes:     Extraocular Movements: Extraocular movements intact.     Conjunctiva/sclera: Conjunctivae normal.     Pupils: Pupils are equal, round, and reactive to light.  Cardiovascular:     Rate and Rhythm: Normal rate and regular rhythm.     Heart sounds: No murmur heard. Pulmonary:     Effort: Pulmonary effort is normal. No respiratory distress.     Breath sounds: Normal breath sounds.  Abdominal:     Palpations: Abdomen is soft.     Tenderness: There is no abdominal tenderness.  Musculoskeletal:        General: No swelling.     Cervical back: Normal range of motion and neck supple.     Right lower leg: No edema.     Left lower leg: No edema.  Skin:    General: Skin is warm and dry.     Capillary Refill: Capillary refill takes less than 2 seconds.  Neurological:     General: No focal deficit present.     Mental Status: He is alert and oriented to person, place, and time.     Cranial Nerves: No cranial nerve deficit.     Sensory: No sensory deficit.     Motor: No weakness.  Psychiatric:        Mood and Affect: Mood normal.     ED Results / Procedures / Treatments   Labs (all labs ordered are listed, but only abnormal results are displayed) Labs Reviewed  CBC WITH DIFFERENTIAL/PLATELET - Abnormal; Notable for the following components:      Result Value   RBC 3.54 (*)    Hemoglobin 11.1 (*)    HCT 33.3 (*)    All other components within normal limits  COMPREHENSIVE METABOLIC PANEL - Abnormal; Notable for the following components:   BUN 39 (*)    Creatinine, Ser 3.82 (*)    Calcium 12.1 (*)    GFR, Estimated 18 (*)    Anion gap 3 (*)    All other components within normal limits  CALCIUM, IONIZED  PARATHYROID HORMONE, INTACT (NO CA)    EKG EKG  Interpretation  Date/Time:  Friday May 16 2022 13:40:26 EDT Ventricular Rate:  87 PR Interval:  182 QRS Duration: 70 QT Interval:  354 QTC Calculation: 425 R Axis:   66 Text Interpretation: Normal sinus rhythm Normal ECG When compared with ECG of 01-Feb-2021 14:28, PREVIOUS ECG IS PRESENT Confirmed by Fredia Sorrow [phone]) on 05/16/2022 4:02:10 PM  Radiology No results found.  Procedures Procedures    Medications Ordered in ED Medications - No  data to display  ED Course/ Medical Decision Making/ A&P                           Medical Decision Making Amount and/or Complexity of Data Reviewed Labs: ordered. Radiology: ordered.  Risk Prescription drug management. Decision regarding hospitalization.   CRITICAL CARE Performed by: Vanetta Mulders Total critical care time: 45 minutes Critical care time was exclusive of separately billable procedures and treating other patients. Critical care was necessary to treat or prevent imminent or life-threatening deterioration. Critical care was time spent personally by me on the following activities: development of treatment plan with patient and/or surrogate as well as nursing, discussions with consultants, evaluation of patient's response to treatment, examination of patient, obtaining history from patient or surrogate, ordering and performing treatments and interventions, ordering and review of laboratory studies, ordering and review of radiographic studies, pulse oximetry and re-evaluation of patient's condition.  Patient very stable here EKG shows no significant prolonged QT's or abnormalities.  No arrhythmias.  Vital signs very normal.  CBC no leukocytosis hemoglobin down a little bit 11.1 so little bit anemia platelets are normal at 243.  Ionized calcium is pending thyroid hormones pending complete metabolic panel significant for calcium of 12.1 BUN of 39 and a creatinine 3.82 potassium normal at 4.4.  This giving this  patient a GFR of 18.  A year ago his GFR's were greater than 60.  Chest x-ray no active disease.  Nephrology has ordered several additional test.  Discussed with unassigned medicine for admission.  Family medicine called back patient will be admitted for the acute kidney injury and for the hypercalcemia.  Patient's is calcium being at 12.1 is not in the critical range of greater than 14.  We will treat just with IV fluids for now.  Do not feel he needs diuretics or any more acute intervention at this time.  Obviously differential includes concerns for malignancy and this can be worked up in the hospital.  Chest x-ray showed no obvious abnormalities.  Final Clinical Impression(s) / ED Diagnoses Final diagnoses:  Hypercalcemia  AKI (acute kidney injury) [facility]    Rx / DC Orders ED Discharge Orders     None         Vanetta Mulders, MD 05/16/22 1755

## 2022-05-16 NOTE — Assessment & Plan Note (Addendum)
Suspected prerenal AKI, although unsure. Cr 3.82, GFR 18. Historical BMP from 2022: Cr 1.05, GFR >60.  - IV fluid hydration given in the ED  - Continue mIVF - Strict I/Os - renal dosing of medications  - Avoid nephrotoxic medications

## 2022-05-16 NOTE — ED Triage Notes (Signed)
Pt arrived POV from home c/o abnormal lab values. Pt states he lab blood work done at his doctors office and they called this morning stating his calcium was high and so was his Bun and to come get checked.

## 2022-05-16 NOTE — ED Notes (Signed)
ED TO INPATIENT HANDOFF REPORT  ED Nurse Name and Phone #: Shirlee Limerick [phone]   S Name/Age/Gender Casey Yoder 56 y.o. male Room/Bed: 020C/020C  Code Status   Code Status: Full Code  Home/SNF/Other Home Patient oriented to: self, place, time, and situation Is this baseline? Yes   Triage Complete: Triage complete  Chief Complaint Hypercalcemia [E83.52]  Triage Note Pt arrived POV from home c/o abnormal lab values. Pt states he lab blood work done at his doctors office and they called this morning stating his calcium was high and so was his Bun and to come get checked.   Allergies No Known Allergies  Level of Care/Admitting Diagnosis ED Disposition     ED Disposition  Admit   Condition  --   Comment  Hospital Area: [facility] [[id number]]  Level of Care: Telemetry Medical [104]  May place patient in observation at [facility] or [place] if equivalent level of care is available:: Yes  Covid Evaluation: Asymptomatic - no recent exposure (last 10 days) testing not required  Diagnosis: Hypercalcemia [275.42.ICD-9-CM]  Admitting Physician: Erskine Emery [[id number]]  Attending Physician: Martyn Malay [[id number]]          B Medical/Surgery History Past Medical History:  Diagnosis Date   Bipolar 1 disorder ([place])    Chest pain    Obesity    OBSTRUCTIVE SLEEP APNEA 10/26/2009   Qualifier: Diagnosis of  By: Gwenette Greet MD, Armando Reichert    PTSD (post-traumatic stress disorder)    Past Surgical History:  Procedure Laterality Date   KNEE SURGERY  08/25/1984   WRIST SURGERY       A IV Location/Drains/Wounds Patient Lines/Drains/Airways Status     Active Line/Drains/Airways     Name Placement date Placement time Site Days   Peripheral IV 05/16/22 20 G Right Forearm 05/16/22  1620  Forearm  less than 1            Intake/Output Last 24 hours  Intake/Output Summary (Last 24 hours) at 05/16/2022 1823 Last data filed at 05/16/2022 1811 Gross per 24  hour  Intake 1000 ml  Output 400 ml  Net 600 ml    Labs/Imaging Results for orders placed or performed during the hospital encounter of 05/16/22 (from the past 48 hour(s))  CBC with Differential     Status: Abnormal   Collection Time: 05/16/22  1:38 PM  Result Value Ref Range   WBC 6.8 4.0 - 10.5 K/uL   RBC 3.54 (L) 4.22 - 5.81 MIL/uL   Hemoglobin 11.1 (L) 13.0 - 17.0 g/dL   HCT 33.3 (L) 39.0 - 52.0 %   MCV 94.1 80.0 - 100.0 fL   MCH 31.4 26.0 - 34.0 pg   MCHC 33.3 30.0 - 36.0 g/dL   RDW 11.7 11.5 - 15.5 %   Platelets 243 150 - 400 K/uL   nRBC 0.0 0.0 - 0.2 %   Neutrophils Relative % 66 %   Neutro Abs 4.5 1.7 - 7.7 K/uL   Lymphocytes Relative 18 %   Lymphs Abs 1.2 0.7 - 4.0 K/uL   Monocytes Relative 11 %   Monocytes Absolute 0.7 0.1 - 1.0 K/uL   Eosinophils Relative 5 %   Eosinophils Absolute 0.3 0.0 - 0.5 K/uL   Basophils Relative 0 %   Basophils Absolute 0.0 0.0 - 0.1 K/uL   Immature Granulocytes 0 %   Abs Immature Granulocytes 0.02 0.00 - 0.07 K/uL    Comment: Performed at [facility], [address] [place], [place] [address]  Comprehensive metabolic panel     Status: Abnormal   Collection Time: 05/16/22  1:38 PM  Result Value Ref Range   Sodium 138 135 - 145 mmol/L   Potassium 4.4 3.5 - 5.1 mmol/L   Chloride 109 98 - 111 mmol/L   CO2 26 22 - 32 mmol/L   Glucose, Bld 90 70 - 99 mg/dL    Comment: Glucose reference range applies only to samples taken after fasting for at least 8 hours.   BUN 39 (H) 6 - 20 mg/dL   Creatinine, Ser 7.40 (H) 0.61 - 1.24 mg/dL   Calcium 81.4 (H) 8.9 - 10.3 mg/dL   Total Protein 7.0 6.5 - 8.1 g/dL   Albumin 3.6 3.5 - 5.0 g/dL   AST 25 15 - 41 U/L   ALT 31 0 - 44 U/L   Alkaline Phosphatase 123 38 - 126 U/L   Total Bilirubin 0.5 0.3 - 1.2 mg/dL   GFR, Estimated 18 (L) >60 mL/min    Comment: (NOTE) Calculated using the CKD-EPI Creatinine Equation (2021)    Anion gap 3 (L) 5 - 15    Comment: Performed at [facility], [address] [place], [place] [address]   DG Chest Port 1 View  Result Date: 05/16/2022 CLINICAL DATA:  Acute renal insufficiency. EXAM: PORTABLE CHEST 1 VIEW COMPARISON:  Chest radiograph dated 02/01/2021. FINDINGS: The heart size and mediastinal contours are within normal limits. Both lungs are clear. The visualized skeletal structures are unremarkable. IMPRESSION: No active disease. Electronically Signed   By: Elgie Collard M.D.   On: 05/16/2022 17:16    Pending Labs Unresulted Labs (From admission, onward)     Start     Ordered   05/17/22 0500  Renal function panel  Daily at 5am,   R      05/16/22 1651   05/17/22 0500  CBC  Tomorrow morning,   R        05/16/22 1822   05/16/22 1651  VITAMIN D 25 Hydroxy (Vit-D Deficiency, Fractures)  Add-on,   AD        05/16/22 1651   05/16/22 1650  Parathyroid hormone, intact (no Ca)  Once,   URGENT        05/16/22 1651   05/16/22 1650  PTH-related peptide  Add-on,   AD        05/16/22 1651   05/16/22 1649  Urinalysis, Complete w Microscopic Urine, Clean Catch  Once,   URGENT        05/16/22 1651   05/16/22 1642  Urinalysis, Routine w reflex microscopic Urine, Clean Catch  Once,   URGENT        05/16/22 1641   05/16/22 1335  Parathyroid hormone, intact (no Ca)  Once,   URGENT        05/16/22 1334   05/16/22 1334  Calcium, ionized  Once,   STAT        05/16/22 1334            Vitals/Pain Today's Vitals   05/16/22 1730 05/16/22 1745 05/16/22 1800 05/16/22 1810  BP: 125/89 134/86 132/86   Pulse: 76 84 79   Resp: 13 16 20    Temp:    98.2 F (36.8 C)  TempSrc:    Oral  SpO2: 95% 100% 98%   Weight:      Height:      PainSc:        Isolation  Precautions No active isolations  Medications Medications  0.9 %  sodium chloride infusion ( Intravenous New Bag/Given 05/16/22 1733)  metoprolol tartrate (LOPRESSOR) tablet 25 mg (has no administration in time range)  lamoTRIgine (LAMICTAL) tablet 25 mg (has no administration in time  range)  lurasidone (LATUDA) tablet 20 mg (has no administration in time range)  tamsulosin (FLOMAX) capsule 0.4 mg (has no administration in time range)  sodium chloride 0.9 % bolus 1,000 mL (0 mLs Intravenous Stopped 05/16/22 1736)    Mobility walks Low fall risk    R Recommendations: See Admitting Provider Note  Report given to:   Additional Notes:

## 2022-05-16 NOTE — Progress Notes (Signed)
Set patient a CPAP unit in his room and showed him how to use it. Patient understands how to turn machine on and how to place the mask on. Patient stated he would put the cpap on later.

## 2022-05-16 NOTE — Consult Note (Signed)
[facility] Renal Consultation Note  Requesting MD: Rogene Houston Indication for Consultation: hypercalcemia- new crt of 3.8  HPI:  Casey Yoder is a 56 y.o. male with past medical history significant for bipolar d/o/PTSD-   on latuda/lamotrigene/wellbutrin as well as within the last year being started on provigil.  Pt had a physical last in September of 22-  crt was 1.05-  calcium was 10.2-  apparently was on high dose vitamin D that was stopped then ( a year ago) Pt had undergone a 100 pound weight loss during 2022 he says purposeful and has maintained - not continued to lose weight.  He had noticed that he has not felt "right " in 8-9 months.  He reports fatigue, dizziness/vertigo type sxms, dry mouth, bad taste in mouth, some fuzziness in his thinking.  He blames a lot of this on the provigil.  He also notes polyuria and nocturia.  He has not been taking any NSAIDS or vitamin D of late.  NO edema, nausea.   HIs wife if a resp therapist here at [facility]  Creatinine, Ser  Date/Time Value Ref Range Status  05/16/2022 01:38 PM 3.82 (H) 0.61 - 1.24 mg/dL Final  05/16/2021 10:40 AM 1.05 0.76 - 1.27 mg/dL Final  02/01/2021 02:34 PM 1.32 (H) 0.61 - 1.24 mg/dL Final  02/13/2017 12:15 AM 1.11 0.61 - 1.24 mg/dL Final  02/12/2017 04:22 PM 1.14 0.61 - 1.24 mg/dL Final  02/12/2017 01:37 PM 1.18 0.61 - 1.24 mg/dL Final     PMHx:   Past Medical History:  Diagnosis Date   Bipolar 1 disorder ([place])    Chest pain    Obesity    OBSTRUCTIVE SLEEP APNEA 10/26/2009   Qualifier: Diagnosis of  By: Gwenette Greet MD, Armando Reichert    PTSD (post-traumatic stress disorder)     Past Surgical History:  Procedure Laterality Date   KNEE SURGERY  08/25/1984   WRIST SURGERY      Family Hx:  Family History  Problem Relation Age of Onset   Hypertension Mother    Heart disease Maternal Grandfather 96    Social History:  reports that he has never smoked. He has never used smokeless tobacco. He reports that he  does not drink alcohol and does not use drugs.  Allergies: No Known Allergies  Medications: Prior to Admission medications   Medication Sig Start Date End Date Taking? Authorizing Provider  buPROPion (WELLBUTRIN XL) 300 MG 24 hr tablet Take 300 mg by mouth in the morning. 01/09/21   [provider]  doxycycline (VIBRAMYCIN) 100 MG capsule Take 1 capsule (100 mg total) by mouth 2 (two) times daily. Patient not taking: Reported on 05/16/2021 02/01/21   Noemi Chapel, MD  ergocalciferol (VITAMIN D2) 1.25 MG (50000 UT) capsule Take 50,000 Units by mouth once a week.    [provider]  LAMOTRIGINE PO Take 20 mg by mouth at bedtime.    [provider]  LORazepam (ATIVAN) 0.5 MG tablet Take 0.5 mg by mouth as needed. Patient not taking: Reported on 05/16/2021 03/07/20   [provider]  lurasidone (LATUDA) 40 MG TABS tablet Take 20 mg by mouth at bedtime. 03/07/20   [provider]  MAGNESIUM PO Take 1 tablet by mouth every evening.    [provider]  metoprolol tartrate (LOPRESSOR) 25 MG tablet TAKE 2 HOURS PRIOR TO CT SCAN 05/16/21   Lelon Perla, MD  modafinil (PROVIGIL) 200 MG tablet Take 200 mg by mouth daily. 05/15/21  [provider]  naproxen sodium (ANAPROX) 220 MG tablet Take 220 mg by mouth 2 (two) times daily as needed (pain). Patient not taking: Reported on 05/16/2021    [provider]  POTASSIUM PO Take 1 tablet by mouth daily. Patient not taking: Reported on 05/16/2021    [provider]  sildenafil (VIAGRA) 100 MG tablet TAKE ONE TABLET BY MOUTH AS DIRECTED (TAKE 1 HOUR PRIOR TO SEXUAL ACTIVITY *DO NOT EXCEED 1 DOSE PER 24 HOUR PERIOD*) 04/05/21   [provider]  vitamin B-12 (CYANOCOBALAMIN) 500 MCG tablet Take 1 tablet by mouth daily. 01/04/20   [provider]    I have reviewed the patient's current medications.  Labs:  Results for orders placed or performed during the hospital  encounter of 05/16/22 (from the past 48 hour(s))  CBC with Differential     Status: Abnormal   Collection Time: 05/16/22  1:38 PM  Result Value Ref Range   WBC 6.8 4.0 - 10.5 K/uL   RBC 3.54 (L) 4.22 - 5.81 MIL/uL   Hemoglobin 11.1 (L) 13.0 - 17.0 g/dL   HCT 62.8 (L) 36.6 - 29.4 %   MCV 94.1 80.0 - 100.0 fL   MCH 31.4 26.0 - 34.0 pg   MCHC 33.3 30.0 - 36.0 g/dL   RDW 76.5 46.5 - 03.5 %   Platelets 243 150 - 400 K/uL   nRBC 0.0 0.0 - 0.2 %   Neutrophils Relative % 66 %   Neutro Abs 4.5 1.7 - 7.7 K/uL   Lymphocytes Relative 18 %   Lymphs Abs 1.2 0.7 - 4.0 K/uL   Monocytes Relative 11 %   Monocytes Absolute 0.7 0.1 - 1.0 K/uL   Eosinophils Relative 5 %   Eosinophils Absolute 0.3 0.0 - 0.5 K/uL   Basophils Relative 0 %   Basophils Absolute 0.0 0.0 - 0.1 K/uL   Immature Granulocytes 0 %   Abs Immature Granulocytes 0.02 0.00 - 0.07 K/uL    Comment: Performed at [facility], [address], [place], [place] [address]  Comprehensive metabolic panel     Status: Abnormal   Collection Time: 05/16/22  1:38 PM  Result Value Ref Range   Sodium 138 135 - 145 mmol/L   Potassium 4.4 3.5 - 5.1 mmol/L   Chloride 109 98 - 111 mmol/L   CO2 26 22 - 32 mmol/L   Glucose, Bld 90 70 - 99 mg/dL    Comment: Glucose reference range applies only to samples taken after fasting for at least 8 hours.   BUN 39 (H) 6 - 20 mg/dL   Creatinine, Ser 1.27 (H) 0.61 - 1.24 mg/dL   Calcium 51.7 (H) 8.9 - 10.3 mg/dL   Total Protein 7.0 6.5 - 8.1 g/dL   Albumin 3.6 3.5 - 5.0 g/dL   AST 25 15 - 41 U/L   ALT 31 0 - 44 U/L   Alkaline Phosphatase 123 38 - 126 U/L   Total Bilirubin 0.5 0.3 - 1.2 mg/dL   GFR, Estimated 18 (L) >60 mL/min    Comment: (NOTE) Calculated using the CKD-EPI Creatinine Equation (2021)    Anion gap 3 (L) 5 - 15    Comment: Performed at [facility], [address], [place], [place] [address]     ROS:  Pertinent items noted in HPI and remainder of comprehensive ROS otherwise  negative.  Physical Exam: Vitals:   05/16/22 1308 05/16/22 1600  BP: (!) 132/93 130/88  Pulse:  87 89  Resp: 18 12  Temp: 98.3 F (36.8 C)   SpO2: 98% 100%     General:  well appearing WM-  NAD HEENT:  PERRLA, EOMI-  dry MM Neck: no JVD Heart: RRR Lungs: really clear Abdomen: soft non tender-  cannot palpate bladder Extremities: no edema  Skin:warm and dry  Neuro: alert, non focal   Assessment/Plan: 56 year old WM with only psych history - now presenting with hypercalcemia and an elevated crt  1.Renal- in September of 22 crt 1.05.  unclear if this change is acute/subacute or chronic.  It does not appear that he is having any sxms of this CKD nor would I expect him to at this current level.  None of the meds that he is on are notorious for causing renal failure.  High calcium can cause some AKI but not sure I have seen a crt close to 4 with a calcium of 12 -  is usually associated with a much higher calcium.  Am going to check U/A and renal ultrasound to start the work up  2. Hypercalcemia-  unclear cause.  He has some sxms possibly consistent with volume depletion-  will check orthostatics and hydrate.  Check pth and pth like peptide and vitamin D level.  If calcium is still up in the AM will consider pamidronate 3. Hypertension/volume  - BP seems fine- checking orthostatics and hydrating gently  4. Anemia  - hgb lower than I would expect-  making me think his CKD could be chronic    Cecille Aver 05/16/2022, 4:38 PM

## 2022-05-16 NOTE — Progress Notes (Signed)
FMTS Interim Progress Note  S:Went to bedside alongside Dr. Jerilee Hoh to check on patient. He denies any concerns. Reports that he went to his PCP to get some blood work and was instructed to come to the ED given his kidneys worsening. Over the past 8-9 months, he has felt fatigue and just not himself. Also reports an intentional weight loss of 100 pounds. Denies any other symptoms including dyspnea and chest pain.   O: BP 123/83 (BP Location: Left Arm)   Pulse 77   Temp 98.5 F (36.9 C) (Oral)   Resp 19   Ht 5\' 7"  (1.702 m)   Wt 78.9 kg   SpO2 99%   BMI 27.25 kg/m   General: Patient sitting upright comfortably in bed and watching tv, in no acute distress. CV: RRR, no murmurs or gallops auscultated Resp: CTAB, no wheezing, rales or rhonchi noted, breathing comfortably on room air Abdomen: soft, nontender, nondistended, presence of bowel sounds Ext: no LE edema noted bilaterally, distal pulses strong and equal bilaterally Psych: mood appropriate, very pleasant   A/P: Patient presents with hypercalcemia and worsening renal function, admitted for AKI with Cr 3.82 on admission. Nephrology consulted and appreciate continued recommendations and involvement. Continue IVF NS at 75 mL/hr for now, monitoring fluid status but currently remains euvolemic on exam. Vitals stable and orders reviewed. Will monitor intake and output, pending morning labs. Remainder of plan per day team.   Donney Dice, DO 05/16/2022, 10:14 PM PGY-3, [facility] pager [phone]

## 2022-05-16 NOTE — Hospital Course (Signed)
Casey Yoder is a 56 y.o. male presenting for abnormal lab values and not feeling well for 6 months. Past medical history significant for BP1D, OSA on CPAP, BPH, and HTN. His hospital course is outlined below:   Hypercalcemia:  Patient presented to the ED with hypercalcemia to 12.1 from outside PCP. Nephrology started following and ordered PTHrP/PTH/renal ultrasound with concern of malignancy in addition to SPEP/light chains. Renal U/S showed slightly echogenic kidneys bilaterally consistent with medical renal disease. No hydronephrosis. Bone survey unremarkable.  PTH low, concerning for nonparathyroid hormone related hypercalcemia.  At time of discharge, PTH RP and SPEP/light chains were pending, can be followed up outpatient.  Hypercalcemia improved during admission, follow this outpatient  AKI:  Lack of data from previous kidney function before this admission.  Unsure of length of time patient has had kidney disease.  Improvement of kidney disease with hydration.  Nephrology is following outpatient as well as gave recommendations inpatient, noted above.  He will receive Aredia prior to discharge from here and was instructed to remain hydrated.  Follow-up with nephrology outpatient.  See renal ultrasound above.  BPH: Proscar added with Flomax   BP1D: Renally dosed Wellbutrin as appropriate. Continue with Latuda and Lamotrigine home dosing    PCP follow up:  Repeat BMP to monitor for electrolyte disarray, most notably hypercalcemia BMP to monitor kidney function as well CBC on follow-up, anemic while in hospital Follow-up on pending labs as noted above Renally dosed Wellbutrin, ensure taking lower dose  Follow-up nephrology and PCP

## 2022-05-16 NOTE — ED Provider Triage Note (Signed)
Emergency Medicine Provider Triage Evaluation Note  Casey Yoder , a 56 y.o. male  was evaluated in triage.  Pt complains of concerns for abnormal lab.  Was told to come in by the primary care provider due to elevated calcium as well as elevated BUN.  No meds tried prior to arrival.  Denies chest pain, shortness of breath, abdominal pain, nausea, vomiting.  Review of Systems  Positive:  Negative:   Physical Exam  BP (!) 132/93 (BP Location: Right Arm)   Pulse 87   Temp 98.3 F (36.8 C) (Oral)   Resp 18   Ht 5\' 7"  (1.702 m)   Wt 78.9 kg   SpO2 98%   BMI 27.25 kg/m  Gen:   Awake, no distress   Resp:  Normal effort  MSK:   Moves extremities without difficulty  Other:    Medical Decision Making  Medically screening exam initiated at 1:34 PM.  Appropriate orders placed.  Casey Yoder was informed that the remainder of the evaluation will be completed by another provider, this initial triage assessment does not replace that evaluation, and the importance of remaining in the ED until their evaluation is complete.  Work-up initiated   Maud Rubendall A, PA-C 05/16/22 1334

## 2022-05-16 NOTE — Assessment & Plan Note (Addendum)
Continue home Latuda and Lamictal, current dose appropriate and does not need renally dosed. Wellbutrin dose decreased to 150 mg for renal dosing.

## 2022-05-16 NOTE — Assessment & Plan Note (Addendum)
Nephrology is on board, appreciate assistance.  Bone survey for metastasis returned negative. PTH studies still in process. -Per Nephrology -F/u BMP -F/u PTH and PTH related protein -continue to monitor urinary output

## 2022-05-16 NOTE — Assessment & Plan Note (Addendum)
CPAP nightly

## 2022-05-17 ENCOUNTER — Observation Stay (HOSPITAL_COMMUNITY): Payer: No Typology Code available for payment source

## 2022-05-17 DIAGNOSIS — R350 Frequency of micturition: Secondary | ICD-10-CM | POA: Diagnosis present

## 2022-05-17 DIAGNOSIS — G4733 Obstructive sleep apnea (adult) (pediatric): Secondary | ICD-10-CM | POA: Diagnosis present

## 2022-05-17 DIAGNOSIS — F319 Bipolar disorder, unspecified: Secondary | ICD-10-CM

## 2022-05-17 DIAGNOSIS — F4310 Post-traumatic stress disorder, unspecified: Secondary | ICD-10-CM | POA: Diagnosis present

## 2022-05-17 DIAGNOSIS — Z79899 Other long term (current) drug therapy: Secondary | ICD-10-CM | POA: Diagnosis not present

## 2022-05-17 DIAGNOSIS — N179 Acute kidney failure, unspecified: Secondary | ICD-10-CM | POA: Diagnosis present

## 2022-05-17 DIAGNOSIS — D649 Anemia, unspecified: Secondary | ICD-10-CM | POA: Insufficient documentation

## 2022-05-17 DIAGNOSIS — Z87442 Personal history of urinary calculi: Secondary | ICD-10-CM | POA: Diagnosis not present

## 2022-05-17 DIAGNOSIS — K5900 Constipation, unspecified: Secondary | ICD-10-CM | POA: Diagnosis not present

## 2022-05-17 DIAGNOSIS — Z8249 Family history of ischemic heart disease and other diseases of the circulatory system: Secondary | ICD-10-CM | POA: Diagnosis not present

## 2022-05-17 DIAGNOSIS — I10 Essential (primary) hypertension: Secondary | ICD-10-CM | POA: Diagnosis present

## 2022-05-17 DIAGNOSIS — D638 Anemia in other chronic diseases classified elsewhere: Secondary | ICD-10-CM | POA: Diagnosis present

## 2022-05-17 DIAGNOSIS — N401 Enlarged prostate with lower urinary tract symptoms: Secondary | ICD-10-CM | POA: Diagnosis present

## 2022-05-17 LAB — CBC
HCT: 29 % — ABNORMAL LOW (ref 39.0–52.0)
Hemoglobin: 9.9 g/dL — ABNORMAL LOW (ref 13.0–17.0)
MCH: 31.5 pg (ref 26.0–34.0)
MCHC: 34.1 g/dL (ref 30.0–36.0)
MCV: 92.4 fL (ref 80.0–100.0)
Platelets: 185 10*3/uL (ref 150–400)
RBC: 3.14 MIL/uL — ABNORMAL LOW (ref 4.22–5.81)
RDW: 11.8 % (ref 11.5–15.5)
WBC: 6.3 10*3/uL (ref 4.0–10.5)
nRBC: 0 % (ref 0.0–0.2)

## 2022-05-17 LAB — IRON AND TIBC
Iron: 84 ug/dL (ref 45–182)
Saturation Ratios: 33 % (ref 17.9–39.5)
TIBC: 258 ug/dL (ref 250–450)
UIBC: 174 ug/dL

## 2022-05-17 LAB — RETICULOCYTES
Immature Retic Fract: 2.7 % (ref 2.3–15.9)
RBC.: 3.11 MIL/uL — ABNORMAL LOW (ref 4.22–5.81)
Retic Count, Absolute: 25.8 10*3/uL (ref 19.0–186.0)
Retic Ct Pct: 0.8 % (ref 0.4–3.1)

## 2022-05-17 LAB — RENAL FUNCTION PANEL
Albumin: 2.9 g/dL — ABNORMAL LOW (ref 3.5–5.0)
Anion gap: 6 (ref 5–15)
BUN: 36 mg/dL — ABNORMAL HIGH (ref 6–20)
CO2: 24 mmol/L (ref 22–32)
Calcium: 11 mg/dL — ABNORMAL HIGH (ref 8.9–10.3)
Chloride: 109 mmol/L (ref 98–111)
Creatinine, Ser: 3.46 mg/dL — ABNORMAL HIGH (ref 0.61–1.24)
GFR, Estimated: 20 mL/min — ABNORMAL LOW (ref >60)
Glucose, Bld: 101 mg/dL — ABNORMAL HIGH (ref 70–99)
Phosphorus: 4.1 mg/dL (ref 2.5–4.6)
Potassium: 4 mmol/L (ref 3.5–5.1)
Sodium: 139 mmol/L (ref 135–145)

## 2022-05-17 LAB — FERRITIN: Ferritin: 256 ng/mL (ref 24–336)

## 2022-05-17 MED ORDER — SENNOSIDES-DOCUSATE SODIUM 8.6-50 MG PO TABS: 1.0000 | ORAL_TABLET | Freq: Every evening | ORAL | Status: DC | PRN

## 2022-05-17 MED ORDER — BUPROPION HCL ER (XL) 150 MG PO TB24
150.0000 mg | ORAL_TABLET | Freq: Every day | ORAL | Status: DC
  Administered 2022-05-17 – 2022-05-18 (×2): 150 mg via ORAL
  Filled 2022-05-17 (×2): qty 1

## 2022-05-17 MED ORDER — SODIUM CHLORIDE 0.9 % IV SOLN
510.0000 mg | Freq: Once | INTRAVENOUS | Status: AC
  Administered 2022-05-17: 510 mg via INTRAVENOUS
  Filled 2022-05-17: qty 17

## 2022-05-17 MED ORDER — POLYETHYLENE GLYCOL 3350 17 G PO PACK: 17.0000 g | PACK | Freq: Every day | ORAL | Status: DC | PRN

## 2022-05-17 NOTE — Progress Notes (Signed)
Pt will self administer CPAP  

## 2022-05-17 NOTE — Progress Notes (Addendum)
Daily Progress Note Intern Pager: [phone]  Patient name: Casey Yoder Medical record number: [id number] Date of birth: 09-15-1965 Age: 56 y.o. Gender: male  Primary Care Provider: Vickii Penna, MD Consultants: Nephrology  Code Status: Full code   Pt Overview and Major Events to Date:  9/22: Admitted   Assessment and Plan: Elic Vencill is a 56 y.o. male presenting with AKI and hypercalcemia. Pertinent PMH/PSH includes bipolar 1 disorder and OSA. * Hypercalcemia Ca improved to 11 from 12.1 on admission. No B symptoms however patient reports fatigue and not feeling like himself over the past 8-9  Months. Has had intentional weight loss of 100 pounds. PTH studies pending, also concerned for malignancy but unable to do imaging with contrast given AKI. Likely can benefit from imaging outpatient. Also PSA wnl.  -nephrology consulted, appreciate recs and continued involvement  - monitor Ca  - continue telemetry  - pending PTH and PTH related protein - continue to monitor urinary output  - continue mIVF 77mL/hr  Anemia Normocytic anemia, Hgb 11.1>9.9 likely secondary to dilutional causes. Anemia likely secondary to anemia of chronic disease. Ferritin, iron studies, retic count and TIBC. No bleeding source.  - trend CBC  AKI (acute kidney injury) ([place]) Cr improved with gentle fluids from 3.82 on admission to 3.46. Baseline appears to be around Cr 1. Likely prerenal, unsure if Cr has progressed over the past year or more acutely over the past few days/weeks. Renal ultrasound demonstrated no hydronephrosis with slightly echogenic kidneys bilaterally consistent with medical renal disease. Recent UOP 1600.  - nephro consulted, appreciate recs  - continue gentle fluids  - Strict I/Os, monitor urinary output - renal dosing of medications  - Avoid nephrotoxic medications   Bipolar 1 disorder ([facility]) Continue home Latuda and Lamictal, current dose appropriate and does not need  renally dosed   OBSTRUCTIVE SLEEP APNEA Intermittent use of CPAP, but discussed with patient and wife that this is not necessary unless patient prefers given his recent weight loss.    Chronic and stable conditions: Mood changes: Home med includes Wellbutrin 300 mg daily, restarted renally dosed Wellbutrin 150 mg daily.    FEN/GI: renal with 1200 mL fluid restriction  PPx: renally dosed lovenox  Dispo:Home pending clinical improvement . Barriers include continued workup for worsening renal function.   Subjective:  No acute overnight events reported. Wife also at bedside, had extensive discussion with both of them regarding the progress of workup thus far. All questions answered appropriately, discussed that we are still in the process of ongoing workup. They are amendable to any testing during his hospital stay.   Objective: Temp:  [97.9 F (36.6 C)-98.5 F (36.9 C)] 98.2 F (36.8 C) (09/23 0555) Pulse Rate:  [71-89] 77 (09/23 0555) Resp:  [12-24] 16 (09/23 0555) BP: (113-134)/(81-93) 122/89 (09/23 0555) SpO2:  [95 %-100 %] 97 % (09/23 0555) Weight:  [78.9 kg] 78.9 kg (09/22 1307) Physical Exam: General: Patient sitting upright, in no acute distress. Cardiovascular: RRR, no murmurs or gallops auscultated Respiratory: CTAB, no wheezing, rales or rhonchi noted, breathing comfortably on room air  Abdomen: soft, nontender, nondistended, presence of bowel sounds  Extremities: no LE edema noted bilaterally, distal pulses strong and equal bilaterally  Psych: mood appropriate, pleasant   Laboratory: Most recent CBC Lab Results  Component Value Date   WBC 6.3 05/17/2022   HGB 9.9 (L) 05/17/2022   HCT 29.0 (L) 05/17/2022   MCV 92.4 05/17/2022   PLT 185 05/17/2022  Most recent BMP    Latest Ref Rng & Units 05/17/2022    1:35 AM  BMP  Glucose 70 - 99 mg/dL 604   BUN 6 - 20 mg/dL 36   Creatinine 5.40 - 1.24 mg/dL 9.81   Sodium 191 - 478 mmol/L 139   Potassium 3.5 - 5.1  mmol/L 4.0   Chloride 98 - 111 mmol/L 109   CO2 22 - 32 mmol/L 24   Calcium 8.9 - 10.3 mg/dL 29.5     Imaging/Diagnostic Tests: Radiologist Impression: US RENAL  Result Date: 05/16/2022 CLINICAL DATA:  Acute kidney injury EXAM: RENAL / URINARY TRACT ULTRASOUND COMPLETE COMPARISON:  None Available. FINDINGS: Right Kidney: Renal measurements: 9.6 x 5.2 x 5.9 cm = volume: 155.8 mL. Cortex appears slightly echogenic. No mass or hydronephrosis. Left Kidney: Renal measurements: 11.6 x 7 x 6.8 cm = volume: 281.5 mL. Cortex appears slightly echogenic. No hydronephrosis. Cyst at the lower pole measuring 28 mm, no imaging follow-up is recommended. Bladder: Appears normal for degree of bladder distention. Other: None. IMPRESSION: Slightly echogenic kidneys bilaterally consistent with medical renal disease. No hydronephrosis Electronically Signed   By: Jasmine Pang M.D.   On: 05/16/2022 18:31   DG Chest Port 1 View  Result Date: 05/16/2022 CLINICAL DATA:  Acute renal insufficiency. EXAM: PORTABLE CHEST 1 VIEW COMPARISON:  Chest radiograph dated 02/01/2021. FINDINGS: The heart size and mediastinal contours are within normal limits. Both lungs are clear. The visualized skeletal structures are unremarkable. IMPRESSION: No active disease. Electronically Signed   By: Elgie Collard M.D.   On: 05/16/2022 17:16     Reece Leader, DO 05/17/2022, 7:29 AM  PGY-3, [facility] FPTS Intern pager: [phone], text pages welcome Secure chat group [facility] Teaching Service

## 2022-05-17 NOTE — Assessment & Plan Note (Addendum)
Normocytic anemia, possibly in the setting of CKD vs dilution with fluids. Ferritin, iron studies, retic count and TIBC largely unremarkable- did receive dose of Feraheme 9/23. Hemodynamically stable and no concern for active bleed at this time. -Follow CBC, transfusion threshold <7 -FOBT previously ordered, f/u

## 2022-05-17 NOTE — Progress Notes (Signed)
OT Cancellation Note  Patient Details Name: Bunyan Brier MRN: [id number] DOB: 12/15/1965   Cancelled Treatment:    Reason Eval/Treat Not Completed: OT screened, no needs identified, will sign off.  Notified patient is independent and does not need OT.  Will sign off.    Ilhan Madan D Damian Buckles 05/17/2022, 1:02 PM 05/17/2022  RP, OTR/L  Acute Rehabilitation Services  Office:  [phone]

## 2022-05-17 NOTE — Progress Notes (Signed)
PT Cancellation Note  Patient Details Name: Casey Yoder MRN: [id number] DOB: 1966-03-07   Cancelled Treatment:    Reason Eval/Treat Not Completed: PT screened, no needs identified, will sign off (pt independent).  Wyona Almas, PT, DPT Acute Rehabilitation Services Office [phone]    Deno Etienne 05/17/2022, 11:50 AM

## 2022-05-17 NOTE — Progress Notes (Signed)
Subjective:  no issues overnight-   hemodynamically stable-  calcium has come down to 11 with IV hydration only - crt down to 3.4.  work up so far no smoking gun   Objective Vital signs in last 24 hours: Vitals:   05/16/22 2103 05/17/22 0001 05/17/22 0555 05/17/22 0806  BP: 123/83 124/85 122/89 112/81  Pulse: 77 71 77 71  Resp: 19 18 16 16   Temp: 98.5 F (36.9 C) 97.9 F (36.6 C) 98.2 F (36.8 C) 98.3 F (36.8 C)  TempSrc: Oral Oral Oral Oral  SpO2: 99% 99% 97% 99%  Weight:      Height:       Weight change:   Intake/Output Summary (Last 24 hours) at 05/17/2022 0855 Last data filed at 05/17/2022 0700 Gross per 24 hour  Intake 2176.05 ml  Output 2800 ml  Net -623.95 ml    Assessment/Plan: 56 year old WM with only psych history - now presenting with hypercalcemia and an elevated crt  1.Renal- in September of 22 crt 1.05.  unclear if this change is acute/subacute or chronic.    urinalysis negative for blood or protein.   Renal ultrasound reveals a smaller right kidney but both are echogenic indicating chronic kidney disease but with bland urine unclear what the etiology could be.  His anemia also indicates some chronicity of renal insufficiency.  It does not appear that he is having any sxms of this CKD nor would I expect him to at this current level.  None of the meds that he is on are notorious for causing renal failure.  High calcium can cause some AKI but not sure I have seen a crt close to 4 with a calcium of 12 -  is usually associated with a much higher calcium.  I dont see a role for serologies or a renal biopsy right at this time.  Will check SPEP and free light chains as that could maybe put this all together-  consider bone survey-  did order.  May end up doing kidney biopsy before all is said and done   2. Hypercalcemia-  unclear cause.  He has some sxms possibly consistent with volume depletion-  will check orthostatics  ( were negative) and hydrate.  Check pth and pth like  peptide ( still pending) and vitamin D level. ( WNL)  calcium has improved to 11 only with hydration.    not usually a level where I would do pamidronate.  Will stop IVF as dont think is too dry-  see what calcium does in AM 3. Hypertension/volume  - BP seems fine- checking orthostatics ( were neg) and hydrating gently  4. Anemia  - hgb lower than I would expect-  making me think his CKD could be chronic --  has decreased with hydration-  iron is not that low but will give iron -  hold on ESA until we are sure there is no active malignancy issue     09-18-1991    Labs: Basic Metabolic Panel: Recent Labs  Lab 05/16/22 1338 05/17/22 0135  NA 138 139  K 4.4 4.0  CL 109 109  CO2 26 24  GLUCOSE 90 101*  BUN 39* 36*  CREATININE 3.82* 3.46*  CALCIUM 12.1* 11.0*  PHOS  --  4.1   Liver Function Tests: Recent Labs  Lab 05/16/22 1338 05/17/22 0135  AST 25  --   ALT 31  --   ALKPHOS 123  --   BILITOT 0.5  --  PROT 7.0  --   ALBUMIN 3.6 2.9*   No results for input(s): "LIPASE", "AMYLASE" in the last 168 hours. No results for input(s): "AMMONIA" in the last 168 hours. CBC: Recent Labs  Lab 05/16/22 1338 05/17/22 0135  WBC 6.8 6.3  NEUTROABS 4.5  --   HGB 11.1* 9.9*  HCT 33.3* 29.0*  MCV 94.1 92.4  PLT 243 185   Cardiac Enzymes: No results for input(s): "CKTOTAL", "CKMB", "CKMBINDEX", "TROPONINI" in the last 168 hours. CBG: No results for input(s): "GLUCAP" in the last 168 hours.  Iron Studies:  Recent Labs    05/17/22 0135  IRON 84  TIBC 258  FERRITIN 256   Studies/Results: US RENAL  Result Date: 05/16/2022 CLINICAL DATA:  Acute kidney injury EXAM: RENAL / URINARY TRACT ULTRASOUND COMPLETE COMPARISON:  None Available. FINDINGS: Right Kidney: Renal measurements: 9.6 x 5.2 x 5.9 cm = volume: 155.8 mL. Cortex appears slightly echogenic. No mass or hydronephrosis. Left Kidney: Renal measurements: 11.6 x 7 x 6.8 cm = volume: 281.5 mL. Cortex appears  slightly echogenic. No hydronephrosis. Cyst at the lower pole measuring 28 mm, no imaging follow-up is recommended. Bladder: Appears normal for degree of bladder distention. Other: None. IMPRESSION: Slightly echogenic kidneys bilaterally consistent with medical renal disease. No hydronephrosis Electronically Signed   By: Donavan Foil M.D.   On: 05/16/2022 18:31   DG Chest Port 1 View  Result Date: 05/16/2022 CLINICAL DATA:  Acute renal insufficiency. EXAM: PORTABLE CHEST 1 VIEW COMPARISON:  Chest radiograph dated 02/01/2021. FINDINGS: The heart size and mediastinal contours are within normal limits. Both lungs are clear. The visualized skeletal structures are unremarkable. IMPRESSION: No active disease. Electronically Signed   By: Anner Crete M.D.   On: 05/16/2022 17:16   Medications: Infusions:  sodium chloride 75 mL/hr at 05/17/22 0557    Scheduled Medications:  buPROPion  150 mg Oral Daily   enoxaparin (LOVENOX) injection  30 mg Subcutaneous Q24H   lamoTRIgine  25 mg Oral QHS   lurasidone  20 mg Oral Daily   tamsulosin  0.4 mg Oral Daily    have reviewed scheduled and prn medications.  Physical Exam: General:  NAD Heart: RRR Lungs: mostly clear Abdomen: soft, non tender Extremities: no edema     05/17/2022,8:55 AM  LOS: 0 days

## 2022-05-18 DIAGNOSIS — N179 Acute kidney failure, unspecified: Secondary | ICD-10-CM | POA: Diagnosis not present

## 2022-05-18 DIAGNOSIS — N400 Enlarged prostate without lower urinary tract symptoms: Secondary | ICD-10-CM

## 2022-05-18 DIAGNOSIS — K5900 Constipation, unspecified: Secondary | ICD-10-CM

## 2022-05-18 LAB — CBC
HCT: 32.4 % — ABNORMAL LOW (ref 39.0–52.0)
Hemoglobin: 11.1 g/dL — ABNORMAL LOW (ref 13.0–17.0)
MCH: 31.7 pg (ref 26.0–34.0)
MCHC: 34.3 g/dL (ref 30.0–36.0)
MCV: 92.6 fL (ref 80.0–100.0)
Platelets: 206 10*3/uL (ref 150–400)
RBC: 3.5 MIL/uL — ABNORMAL LOW (ref 4.22–5.81)
RDW: 11.7 % (ref 11.5–15.5)
WBC: 5.6 10*3/uL (ref 4.0–10.5)
nRBC: 0 % (ref 0.0–0.2)

## 2022-05-18 LAB — RENAL FUNCTION PANEL
Albumin: 3.3 g/dL — ABNORMAL LOW (ref 3.5–5.0)
Anion gap: 5 (ref 5–15)
BUN: 29 mg/dL — ABNORMAL HIGH (ref 6–20)
CO2: 25 mmol/L (ref 22–32)
Calcium: 11.3 mg/dL — ABNORMAL HIGH (ref 8.9–10.3)
Chloride: 110 mmol/L (ref 98–111)
Creatinine, Ser: 3.08 mg/dL — ABNORMAL HIGH (ref 0.61–1.24)
GFR, Estimated: 23 mL/min — ABNORMAL LOW (ref >60)
Glucose, Bld: 100 mg/dL — ABNORMAL HIGH (ref 70–99)
Phosphorus: 2.3 mg/dL — ABNORMAL LOW (ref 2.5–4.6)
Potassium: 4.6 mmol/L (ref 3.5–5.1)
Sodium: 140 mmol/L (ref 135–145)

## 2022-05-18 LAB — CALCIUM, IONIZED: Calcium, Ionized, Serum: 7.2 mg/dL — ABNORMAL HIGH (ref 4.5–5.6)

## 2022-05-18 LAB — PARATHYROID HORMONE, INTACT (NO CA)
PTH: 6 pg/mL — ABNORMAL LOW (ref 15–65)
PTH: 19 pg/mL (ref 15–65)

## 2022-05-18 MED ORDER — TAMSULOSIN HCL 0.4 MG PO CAPS: 0.8000 mg | ORAL_CAPSULE | Freq: Every day | ORAL | Status: DC

## 2022-05-18 MED ORDER — SENNOSIDES-DOCUSATE SODIUM 8.6-50 MG PO TABS: 2.0000 | ORAL_TABLET | Freq: Every evening | ORAL | Status: DC | PRN

## 2022-05-18 MED ORDER — TAMSULOSIN HCL 0.4 MG PO CAPS
0.4000 mg | ORAL_CAPSULE | Freq: Every day | ORAL | Status: DC
  Administered 2022-05-18: 0.4 mg via ORAL
  Filled 2022-05-18: qty 1

## 2022-05-18 MED ORDER — POLYETHYLENE GLYCOL 3350 17 G PO PACK
17.0000 g | PACK | Freq: Two times a day (BID) | ORAL | Status: DC
  Administered 2022-05-18: 17 g via ORAL
  Filled 2022-05-18: qty 1

## 2022-05-18 MED ORDER — FINASTERIDE 5 MG PO TABS
5.0000 mg | ORAL_TABLET | Freq: Every day | ORAL | Status: DC
  Administered 2022-05-18: 5 mg via ORAL
  Filled 2022-05-18: qty 1

## 2022-05-18 MED ORDER — SODIUM CHLORIDE 0.9 % IV SOLN
60.0000 mg | Freq: Once | INTRAVENOUS | Status: AC
  Administered 2022-05-18: 60 mg via INTRAVENOUS
  Filled 2022-05-18: qty 20

## 2022-05-18 MED ORDER — BUPROPION HCL ER (XL) 150 MG PO TB24: 150.0000 mg | ORAL_TABLET | Freq: Every day | ORAL | 0 refills | Status: DC

## 2022-05-18 MED ORDER — SENNOSIDES-DOCUSATE SODIUM 8.6-50 MG PO TABS
1.0000 | ORAL_TABLET | Freq: Two times a day (BID) | ORAL | Status: DC
  Administered 2022-05-18: 1 via ORAL
  Filled 2022-05-18: qty 1

## 2022-05-18 MED ORDER — FINASTERIDE 5 MG PO TABS: 5.0000 mg | ORAL_TABLET | Freq: Every day | ORAL | 0 refills | Status: DC

## 2022-05-18 NOTE — Plan of Care (Signed)
  Problem: Health Behavior/Discharge Planning: Goal: Ability to manage health-related needs will improve Outcome: Adequate for Discharge   

## 2022-05-18 NOTE — Progress Notes (Signed)
Subjective:  no issues overnight-   hemodynamically stable-  calcium  11.3 with IV hydration only - crt down to 3.0.  work up so far no smoking gun.  PTH is appropriately depressed    Objective Vital signs in last 24 hours: Vitals:   05/17/22 1656 05/17/22 2022 05/18/22 0404 05/18/22 0827  BP: 122/79 122/83 (!) 126/92 119/86  Pulse: 82 83 73 78  Resp: _0 Temp: 98.3 F (36.8 C) 98.2 F (36.8 C) 98 F (36.7 C) 98.7 F (37.1 C)  TempSrc: Oral Oral Oral Oral  SpO2: 99% 97% 98% 97%  Weight:      Height:       Weight change:   Intake/Output Summary (Last 24 hours) at 05/18/2022 1013 Last data filed at 05/18/2022 0800 Gross per 24 hour  Intake 180 ml  Output 1600 ml  Net -1420 ml    Assessment/Plan: 56 year old WM with only psych history - now presenting with hypercalcemia and an elevated crt  1.Renal- in September of 22 crt 1.05.  unclear if this change is acute/subacute or chronic.    urinalysis negative for blood or protein.   Renal ultrasound reveals a smaller right kidney but both are echogenic indicating chronic kidney disease but with bland urine so unclear what the etiology could be.  His anemia also indicates some chronicity of renal insufficiency.  It does not appear that he is having any sxms of this CKD nor would I expect him to at this current level.  None of the meds that he is on are notorious for causing renal failure.  High calcium can cause some AKI but not sure I have seen a crt close to 4 with a calcium of 12 -  is usually associated with a much higher calcium.  I dont see a role for serologies or a renal biopsy right at this time.  Will check SPEP and free light chains as that could maybe put this all together  ( pending) -   bone survey negative-    May end up doing kidney biopsy before all is said and done but not now.  Crt has imprived from 3.8 to 3.0 so far while here-  good UOP  2. Hypercalcemia-  unclear cause.  He has some sxms possibly consistent with  volume depletion-  will check orthostatics  ( were negative) and hydrate.  Check pth ( was appropriately depressed)  and pth like peptide ( still pending) and vitamin D level. ( WNL)  calcium  improved to 11 only with hydration-  back up to 11.3-  will give dose of pamidronate which should allow it to decrease over the next several days 3. Hypertension/volume  - BP seems fine- checking orthostatics ( were neg) and hydrating gently  4. Anemia  - hgb lower than I would expect-  making me think his CKD could be chronic --  has decreased with hydration-  iron is not that low but gave iron -  hold on ESA until we are sure there is no active malignancy issue  5. Concern over possible malignancy -  PSA normal, has had a colonscopy-  bone survey normal- CXR negative.  No other focal sxms-  not really indicated to do full body scan in search of CA   I really dont think there is more of a role for inpatient management -  once he gets pamidronate he will just need to continue to hydrate at home-  I will  see soon as an OP and will look for results of labs    Louis Meckel    Labs: Basic Metabolic Panel: Recent Labs  Lab 05/16/22 1338 05/17/22 0135 05/18/22 0937  NA 138 139 140  K 4.4 4.0 4.6  CL 109 109 110  CO2 _0 GLUCOSE 90 101* 100*  BUN 39* 36* 29*  CREATININE 3.82* 3.46* 3.08*  CALCIUM 12.1* 11.0* 11.3*  PHOS  --  4.1 2.3*   Liver Function Tests: Recent Labs  Lab 05/16/22 1338 05/17/22 0135  AST 25  --   ALT 31  --   ALKPHOS 123  --   BILITOT 0.5  --   PROT 7.0  --   ALBUMIN 3.6 2.9*   No results for input(s): "LIPASE", "AMYLASE" in the last 168 hours. No results for input(s): "AMMONIA" in the last 168 hours. CBC: Recent Labs  Lab 05/16/22 1338 05/17/22 0135 05/18/22 0937  WBC 6.8 6.3 5.6  NEUTROABS 4.5  --   --   HGB 11.1* 9.9* 11.1*  HCT 33.3* 29.0* 32.4*  MCV 94.1 92.4 92.6  PLT 243 185 206   Cardiac Enzymes: No results for input(s): "CKTOTAL",  "CKMB", "CKMBINDEX", "TROPONINI" in the last 168 hours. CBG: No results for input(s): "GLUCAP" in the last 168 hours.  Iron Studies:  Recent Labs    05/17/22 0135  IRON 84  TIBC 258  FERRITIN 256   Studies/Results: DG Bone Survey Met  Result Date: 05/17/2022 CLINICAL DATA:  Bone survey for metastatic disease. Hypercalcemia. EXAM: METASTATIC BONE SURVEY COMPARISON:  None Available. FINDINGS: Skull: Normal bony mineralization. No focal lytic or blastic osseous lesion. Cervical Spine: Normal bony mineralization. No focal lytic or blastic osseous lesion. Thoracic Spine: Normal bony mineralization. No focal lytic or blastic osseous lesion. Chest: Normal bony mineralization. No focal lytic or blastic osseous lesion. Lumbar Spine: Normal bony mineralization. No focal lytic or blastic osseous lesion. Pelvis: Normal bony mineralization. No focal lytic or blastic osseous lesion. Right Upper Extremity: Normal bony mineralization. No focal lytic or blastic osseous lesion. Left Upper Extremity: Normal bony mineralization. No focal lytic or blastic osseous lesion. Right Lower Extremity: Normal bony mineralization. No focal lytic or blastic osseous lesion. Left Lower Extremity: Normal bony mineralization. No focal lytic or blastic osseous lesion. IMPRESSION: Negative. Electronically Signed   By: Fidela Salisbury M.D.   On: 05/17/2022 13:23   US RENAL  Result Date: 05/16/2022 CLINICAL DATA:  Acute kidney injury EXAM: RENAL / URINARY TRACT ULTRASOUND COMPLETE COMPARISON:  None Available. FINDINGS: Right Kidney: Renal measurements: 9.6 x 5.2 x 5.9 cm = volume: 155.8 mL. Cortex appears slightly echogenic. No mass or hydronephrosis. Left Kidney: Renal measurements: 11.6 x 7 x 6.8 cm = volume: 281.5 mL. Cortex appears slightly echogenic. No hydronephrosis. Cyst at the lower pole measuring 28 mm, no imaging follow-up is recommended. Bladder: Appears normal for degree of bladder distention. Other: None. IMPRESSION:  Slightly echogenic kidneys bilaterally consistent with medical renal disease. No hydronephrosis Electronically Signed   By: Donavan Foil M.D.   On: 05/16/2022 18:31   DG Chest Port 1 View  Result Date: 05/16/2022 CLINICAL DATA:  Acute renal insufficiency. EXAM: PORTABLE CHEST 1 VIEW COMPARISON:  Chest radiograph dated 02/01/2021. FINDINGS: The heart size and mediastinal contours are within normal limits. Both lungs are clear. The visualized skeletal structures are unremarkable. IMPRESSION: No active disease. Electronically Signed   By: Anner Crete M.D.   On: 05/16/2022 17:16   Medications:  Infusions:    Scheduled Medications:  buPROPion  150 mg Oral Daily   enoxaparin (LOVENOX) injection  30 mg Subcutaneous Q24H   finasteride  5 mg Oral Daily   lamoTRIgine  25 mg Oral QHS   lurasidone  20 mg Oral Daily   polyethylene glycol  17 g Oral BID   senna-docusate  1 tablet Oral BID   tamsulosin  0.4 mg Oral Daily    have reviewed scheduled and prn medications.  Physical Exam: General:  NAD Heart: RRR Lungs: mostly clear Abdomen: soft, non tender Extremities: no edema     05/18/2022,10:13 AM  LOS: 1 day

## 2022-05-18 NOTE — Progress Notes (Signed)
Nsg Discharge Note  Admit Date:  05/16/2022 Discharge date: 05/18/2022   Casey Yoder to be D/C'd Home per MD order.  AVS completed.  Patient/caregiver able to verbalize understanding.  Discharge Medication: Allergies as of 05/18/2022   No Known Allergies      Medication List     STOP taking these medications    amoxicillin 500 MG capsule Commonly known as: AMOXIL   cephALEXin 500 MG capsule Commonly known as: KEFLEX   doxycycline 100 MG capsule Commonly known as: VIBRAMYCIN   metoprolol tartrate 25 MG tablet Commonly known as: LOPRESSOR   modafinil 200 MG tablet Commonly known as: PROVIGIL   naproxen sodium 220 MG tablet Commonly known as: ALEVE   ondansetron 8 MG tablet Commonly known as: ZOFRAN   sildenafil 100 MG tablet Commonly known as: VIAGRA       TAKE these medications    buPROPion 150 MG 24 hr tablet Commonly known as: WELLBUTRIN XL Take 1 tablet (150 mg total) by mouth daily. Start taking on: May 19, 2022 What changed:  medication strength how much to take when to take this   cyanocobalamin 500 MCG tablet Commonly known as: VITAMIN B12 Take 1 tablet by mouth daily.   finasteride 5 MG tablet Commonly known as: PROSCAR Take 1 tablet (5 mg total) by mouth daily. Start taking on: May 19, 2022   LAMOTRIGINE PO Take 20 mg by mouth at bedtime.   lurasidone 40 MG Tabs tablet Commonly known as: LATUDA Take 20 mg by mouth at bedtime.   MAGNESIUM PO Take 1 tablet by mouth every evening.   tamsulosin 0.4 MG Caps capsule Commonly known as: FLOMAX Take 0.4 mg by mouth daily.        Discharge Assessment: Vitals:   05/18/22 0827 05/18/22 1620  BP: 119/86 (!) 121/91  Pulse: 78 78  Resp: 18 18  Temp: 98.7 F (37.1 C) 98.5 F (36.9 C)  SpO2: 97% 98%   Skin clean, dry and intact without evidence of skin break down, no evidence of skin tears noted. IV catheter discontinued intact. Site without signs and symptoms of  complications - no redness or edema noted at insertion site, patient denies c/o pain - only slight tenderness at site.  Dressing with slight pressure applied.  D/c Instructions-Education: Discharge instructions given to patient/family with verbalized understanding. D/c education completed with patient/family including follow up instructions, medication list, d/c activities limitations if indicated, with other d/c instructions as indicated by MD - patient able to verbalize understanding, all questions fully answered. Patient instructed to return to ED, call 911, or call MD for any changes in condition.  Patient escorted via [place], and D/C home via private auto.  Casey Server Bernon Arviso  RN 05/18/2022

## 2022-05-18 NOTE — Discharge Instructions (Addendum)
Dear Deedra Ehrich,   Thank you so much for allowing [place] to be part of your care!  You were admitted to [facility] for kidney injury and elevated calcium   So far, all labs seem to have been improving gradually.   We treated you with fluids and made sure to monitor your kidneys. Continue with the lower dose of Wellbutrin. Continue with Flomax and Proscar as well to help your prostate.    We are still waiting on some labs, and will make sure you know when these result.   Please follow up with nephrology as well.   Stay hydrated once you go home.   POST-HOSPITAL & CARE INSTRUCTIONS Please follow up with nephrology and your PCP at the [facility] Please let PCP/Specialists know of any changes that were made.  Please see medications section of this packet for any medication changes.   DOCTOR'S APPOINTMENT & FOLLOW UP CARE INSTRUCTIONS  Make appt with your nephrologist and PCP   RETURN PRECAUTIONS: - shortness of breath  - chest pain  - decreased urination  - swelling in the legs  - confusion, chills, fever   Take care and be well!  [facility]  [place], [place] [address] [phone]

## 2022-05-18 NOTE — Discharge Summary (Signed)
[facility] Discharge Summary  Patient name: Casey Yoder Medical record number: [id number] Date of birth: 1966/05/20 Age: 56 y.o. Gender: male Date of Admission: 05/16/2022  Date of Discharge: 05/18/22 Admitting Physician: Erskine Emery, MD  Primary Care Provider: Vickii Penna, MD Consultants: Nephrology   Indication for Hospitalization: Hypercalcemia, AKI   Discharge Diagnoses/Problem List:  Principal Problem for Admission: Hypercalcemia, AKI  Other Problems addressed during stay:  BPH HTN BP1D OSA  Brief Hospital Course:  Casey Yoder is a 56 y.o. male presenting for abnormal lab values and not feeling well for 6 months. Past medical history significant for BP1D, OSA on CPAP, BPH, and HTN. His hospital course is outlined below:   Hypercalcemia:  Patient presented to the ED with hypercalcemia to 12.1 from outside PCP. Nephrology started following and ordered PTHrP/PTH/renal ultrasound with concern of malignancy in addition to SPEP/light chains. Renal U/S showed slightly echogenic kidneys bilaterally consistent with medical renal disease. No hydronephrosis. Bone survey unremarkable.  PTH low, concerning for nonparathyroid hormone related hypercalcemia.  At time of discharge, PTH RP and SPEP/light chains were pending, can be followed up outpatient.  Hypercalcemia improved during admission, follow this outpatient  AKI:  Lack of data from previous kidney function before this admission.  Unsure of length of time patient has had kidney disease.  Improvement of kidney disease with hydration.  Nephrology is following outpatient as well as gave recommendations inpatient, noted above.  He will receive Aredia prior to discharge from here and was instructed to remain hydrated.  Follow-up with nephrology outpatient.  See renal ultrasound above.  BPH: Proscar added with Flomax   BP1D: Renally dosed Wellbutrin as appropriate. Continue with Latuda and  Lamotrigine home dosing    PCP follow up:  Repeat BMP to monitor for electrolyte disarray, most notably hypercalcemia BMP to monitor kidney function as well CBC on follow-up, anemic while in hospital Follow-up on pending labs as noted above Renally dosed Wellbutrin, ensure taking lower dose  Follow-up nephrology and PCP   Disposition: Home   Discharge Condition: Stable    Discharge Exam:  Vitals:   05/18/22 0404 05/18/22 0827  BP: (!) 126/92 119/86  Pulse: 73 78  Resp: 16 18  Temp: 98 F (36.7 C) 98.7 F (37.1 C)  SpO2: 98% 97%   Physical Exam per Dr. Nita Sells: General: NAD, pleasant, cooperative with examination  Cardiovascular: RRR without murmur Respiratory: CTAB without wheezing/rhonchi/rales Abdomen: Soft, non-tender, non-distended, normoactive BS, no R/G Extremities: No edema, 2+ DP and PT pulses b/l    Significant Procedures: None   Significant Labs and Imaging:  Recent Labs  Lab 05/16/22 1338 05/17/22 0135 05/18/22 0937  WBC 6.8 6.3 5.6  HGB 11.1* 9.9* 11.1*  HCT 33.3* 29.0* 32.4*  PLT 243 185 206   Recent Labs  Lab 05/16/22 1338 05/17/22 0135 05/18/22 0937  NA 138 139 140  K 4.4 4.0 4.6  CL 109 109 110  CO2 26 24 25   GLUCOSE 90 101* 100*  BUN 39* 36* 29*  CREATININE 3.82* 3.46* 3.08*  CALCIUM 12.1* 11.0* 11.3*  PHOS  --  4.1 2.3*  ALKPHOS 123  --   --   AST 25  --   --   ALT 31  --   --   ALBUMIN 3.6 2.9* 3.3*    DG Bone Survey Met  Result Date: 05/17/2022 CLINICAL DATA:  Bone survey for metastatic disease. Hypercalcemia. EXAM: METASTATIC BONE SURVEY COMPARISON:  None Available. FINDINGS: Skull: Normal  bony mineralization. No focal lytic or blastic osseous lesion. Cervical Spine: Normal bony mineralization. No focal lytic or blastic osseous lesion. Thoracic Spine: Normal bony mineralization. No focal lytic or blastic osseous lesion. Chest: Normal bony mineralization. No focal lytic or blastic osseous lesion. Lumbar Spine: Normal bony  mineralization. No focal lytic or blastic osseous lesion. Pelvis: Normal bony mineralization. No focal lytic or blastic osseous lesion. Right Upper Extremity: Normal bony mineralization. No focal lytic or blastic osseous lesion. Left Upper Extremity: Normal bony mineralization. No focal lytic or blastic osseous lesion. Right Lower Extremity: Normal bony mineralization. No focal lytic or blastic osseous lesion. Left Lower Extremity: Normal bony mineralization. No focal lytic or blastic osseous lesion. IMPRESSION: Negative. Electronically Signed   By: Fidela Salisbury M.D.   On: 05/17/2022 13:23   US RENAL  Result Date: 05/16/2022 CLINICAL DATA:  Acute kidney injury EXAM: RENAL / URINARY TRACT ULTRASOUND COMPLETE COMPARISON:  None Available. FINDINGS: Right Kidney: Renal measurements: 9.6 x 5.2 x 5.9 cm = volume: 155.8 mL. Cortex appears slightly echogenic. No mass or hydronephrosis. Left Kidney: Renal measurements: 11.6 x 7 x 6.8 cm = volume: 281.5 mL. Cortex appears slightly echogenic. No hydronephrosis. Cyst at the lower pole measuring 28 mm, no imaging follow-up is recommended. Bladder: Appears normal for degree of bladder distention. Other: None. IMPRESSION: Slightly echogenic kidneys bilaterally consistent with medical renal disease. No hydronephrosis Electronically Signed   By: Donavan Foil M.D.   On: 05/16/2022 18:31   DG Chest Port 1 View  Result Date: 05/16/2022 CLINICAL DATA:  Acute renal insufficiency. EXAM: PORTABLE CHEST 1 VIEW COMPARISON:  Chest radiograph dated 02/01/2021. FINDINGS: The heart size and mediastinal contours are within normal limits. Both lungs are clear. The visualized skeletal structures are unremarkable. IMPRESSION: No active disease. Electronically Signed   By: Anner Crete M.D.   On: 05/16/2022 17:16     Results/Tests Pending at Time of Discharge:  SPEP  Kappa/lambda light chains  PTH related peptide  Discharge Medications:  Allergies as of 05/18/2022   No  Known Allergies      Medication List     STOP taking these medications    amoxicillin 500 MG capsule Commonly known as: AMOXIL   cephALEXin 500 MG capsule Commonly known as: KEFLEX   doxycycline 100 MG capsule Commonly known as: VIBRAMYCIN   metoprolol tartrate 25 MG tablet Commonly known as: LOPRESSOR   modafinil 200 MG tablet Commonly known as: PROVIGIL   naproxen sodium 220 MG tablet Commonly known as: ALEVE   ondansetron 8 MG tablet Commonly known as: ZOFRAN   sildenafil 100 MG tablet Commonly known as: VIAGRA       TAKE these medications    buPROPion 150 MG 24 hr tablet Commonly known as: WELLBUTRIN XL Take 1 tablet (150 mg total) by mouth daily. Start taking on: May 19, 2022 What changed:  medication strength how much to take when to take this   cyanocobalamin 500 MCG tablet Commonly known as: VITAMIN B12 Take 1 tablet by mouth daily.   finasteride 5 MG tablet Commonly known as: PROSCAR Take 1 tablet (5 mg total) by mouth daily. Start taking on: May 19, 2022   LAMOTRIGINE PO Take 20 mg by mouth at bedtime.   lurasidone 40 MG Tabs tablet Commonly known as: LATUDA Take 20 mg by mouth at bedtime.   MAGNESIUM PO Take 1 tablet by mouth every evening.   tamsulosin 0.4 MG Caps capsule Commonly known as: FLOMAX Take 0.4 mg by  mouth daily.        Discharge Instructions: Please refer to Patient Instructions section of EMR for full details.  Patient was counseled important signs and symptoms that should prompt return to medical care, changes in medications, dietary instructions, activity restrictions, and follow up appointments.   Follow-Up Appointments:  Follow-up Information     Vickii Penna, MD. Schedule an appointment as soon as possible for a visit in 1 week(s).   Specialty: Family Medicine Contact information: [place] [address] [phone]         Corliss Parish, MD. Schedule an  appointment as soon as possible for a visit.   Specialty: Nephrology Contact information: [place] [address] [phone]                 Erskine Emery, MD 05/18/2022, 1:15 PM PGY-2, [place]

## 2022-05-18 NOTE — Assessment & Plan Note (Addendum)
Reports infrequent BM. Estimates every week for the last few months. Last BM 9/21. UTD on colonoscopy- per his report there were no significant findings but he did have polyps removed. IBS-C would be uncommon to start at this age. Question if related to hypercalcemia vs structural cause. No hx of previous abdominal surgeries. -Miralax 17 g BID -Senna 1 tablet BID

## 2022-05-18 NOTE — Progress Notes (Addendum)
Daily Progress Note Intern Pager: [phone]  Patient name: Casey Yoder Medical record number: [id number] Date of birth: Jun 03, 1966 Age: 56 y.o. Gender: male  Primary Care Provider: Vickii Penna, MD Consultants: Nephrology Code Status: Full code  Pt Overview and Major Events to Date:  9/22: Admitted  Assessment and Plan: Juandedios Dudash is a 56 y.o. male who was admitted for AKI and hypercalcemia.  Past medical history is significant for bipolar 1 disorder and OSA.  * Hypercalcemia Nephrology is on board, appreciate assistance.  Bone survey for metastasis returned negative. PTH studies still in process. -Per Nephrology -F/u BMP -F/u PTH and PTH related protein -continue to monitor urinary output   AKI (acute kidney injury) ([place]) Cr 3.46 yesterday, AM labs still pending. Fluids are off. Likely component of CKD contributing. Renal ultrasound demonstrated no hydronephrosis with slightly echogenic kidneys bilaterally consistent with medical renal disease. UOP 1500 mL yesterday with one unmeasured void per chart, though patient reports frequent voiding every 1-2 hrs. - nephro consulted, appreciate recs  - F/u AM BMP - Strict I/Os, monitor urinary output - renal dosing of medications  - Avoid nephrotoxic medications  - F/u Kappa/lambda light chains, protein electrophoresis  BPH (benign prostatic hyperplasia) With urinary frequency. Takes Tamsulosin 0.4 mg. -Continue flomax -Add finasteride   Constipation Reports infrequent BM. Estimates every week for the last few months. Last BM 9/21. UTD on colonoscopy- per his report there were no significant findings but he did have polyps removed. IBS-C would be uncommon to start at this age. Question if related to hypercalcemia vs structural cause. No hx of previous abdominal surgeries. -Miralax 17 g BID -Senna 1 tablet BID  Anemia Normocytic anemia, possibly in the setting of CKD vs dilution with fluids. Ferritin, iron studies,  retic count and TIBC largely unremarkable- did receive dose of Feraheme 9/23. Hemodynamically stable and no concern for active bleed at this time. -Follow CBC, transfusion threshold <7 -FOBT previously ordered, f/u   Bipolar 1 disorder ([place]) Continue home Latuda and Lamictal, current dose appropriate and does not need renally dosed. Wellbutrin dose decreased to 150 mg for renal dosing.  OBSTRUCTIVE SLEEP APNEA CPAP nightly    FEN/GI: Renal diet  PPx: Lovenox Dispo:Home pending clinical improvement . Barriers include completion of renal work up.   Subjective:  Mr. Sedano denies any complaints today.  States that he has been urinating often, typically goes every 1-2 hours.  This has been normal for him over the last several months.  His PCP had started him on tamsulosin but he has not felt any improvement thus far.  Last bowel movement was on Thursday 9/21.  He reports chronic constipation over the last few months.  Typically goes once a week.  Denies any abdominal pain, still has good appetite.  Reports that he had a colonoscopy a few months ago in which she had some polyps resected but was told by the GI doctor that "everything looked good", he has follow up with GI in October.  Objective: Temp:  [98 F (36.7 C)-98.4 F (36.9 C)] 98 F (36.7 C) (09/24 0404) Pulse Rate:  [71-83] 73 (09/24 0404) Resp:  [15-16] 16 (09/24 0404) BP: (112-126)/(79-92) 126/92 (09/24 0404) SpO2:  [97 %-99 %] 98 % (09/24 0404) Physical Exam: General: NAD, pleasant, cooperative with examination  Cardiovascular: RRR without murmur Respiratory: CTAB without wheezing/rhonchi/rales Abdomen: Soft, non-tender, non-distended, normoactive BS, no R/G Extremities: No edema, 2+ DP and PT pulses b/l  Laboratory: Most recent CBC Lab  Results  Component Value Date   WBC 6.3 05/17/2022   HGB 9.9 (L) 05/17/2022   HCT 29.0 (L) 05/17/2022   MCV 92.4 05/17/2022   PLT 185 05/17/2022   Most recent BMP    Latest Ref  Rng & Units 05/17/2022    1:35 AM  BMP  Glucose 70 - 99 mg/dL 101   BUN 6 - 20 mg/dL 36   Creatinine 0.61 - 1.24 mg/dL 3.46   Sodium 135 - 145 mmol/L 139   Potassium 3.5 - 5.1 mmol/L 4.0   Chloride 98 - 111 mmol/L 109   CO2 22 - 32 mmol/L 24   Calcium 8.9 - 10.3 mg/dL 11.0    Morning labs pending   Imaging/Diagnostic Tests: DG Bone Survey Met Result Date: 05/17/2022 IMPRESSION: Negative. Electronically Signed   By: Fidela Salisbury M.D.   On: 05/17/2022 13:23    Sharion Settler, DO 05/18/2022, 6:54 AM  PGY-3, [place] Intern pager: [phone], text pages welcome Secure chat group [place]

## 2022-05-18 NOTE — Assessment & Plan Note (Signed)
With urinary frequency. Takes Tamsulosin 0.4 mg. -Continue flomax -Add finasteride

## 2022-05-19 LAB — KAPPA/LAMBDA LIGHT CHAINS
Kappa free light chain: 48 mg/L — ABNORMAL HIGH (ref 3.3–19.4)
Kappa, lambda light chain ratio: 1.54 (ref 0.26–1.65)
Lambda free light chains: 31.2 mg/L — ABNORMAL HIGH (ref 5.7–26.3)

## 2022-05-20 LAB — PTH-RELATED PEPTIDE: PTH-related peptide: <2 pmol/L

## 2022-05-21 ENCOUNTER — Encounter: Payer: Self-pay | Admitting: *Deleted

## 2022-05-23 LAB — PROTEIN ELECTROPHORESIS, SERUM
A/G Ratio: 1.1 (ref 0.7–1.7)
Albumin ELP: 3.1 g/dL (ref 2.9–4.4)
Alpha-1-Globulin: 0.3 g/dL (ref 0.0–0.4)
Alpha-2-Globulin: 0.8 g/dL (ref 0.4–1.0)
Beta Globulin: 0.9 g/dL (ref 0.7–1.3)
Gamma Globulin: 0.7 g/dL (ref 0.4–1.8)
Globulin, Total: 2.7 g/dL (ref 2.2–3.9)
Total Protein ELP: 5.8 g/dL — ABNORMAL LOW (ref 6.0–8.5)

## 2022-05-26 ENCOUNTER — Encounter: Payer: Self-pay | Admitting: Hematology & Oncology

## 2022-05-26 ENCOUNTER — Other Ambulatory Visit: Payer: Self-pay

## 2022-05-26 ENCOUNTER — Inpatient Hospital Stay (HOSPITAL_BASED_OUTPATIENT_CLINIC_OR_DEPARTMENT_OTHER): Payer: No Typology Code available for payment source | Admitting: Hematology & Oncology

## 2022-05-26 ENCOUNTER — Inpatient Hospital Stay: Payer: No Typology Code available for payment source | Attending: Hematology & Oncology

## 2022-05-26 VITALS — BP 120/82 | HR 74 | Temp 98.0°F | Resp 17 | Ht 67.0 in | Wt 175.0 lb

## 2022-05-26 DIAGNOSIS — Z79899 Other long term (current) drug therapy: Secondary | ICD-10-CM | POA: Insufficient documentation

## 2022-05-26 DIAGNOSIS — D649 Anemia, unspecified: Secondary | ICD-10-CM | POA: Diagnosis not present

## 2022-05-26 DIAGNOSIS — D500 Iron deficiency anemia secondary to blood loss (chronic): Secondary | ICD-10-CM

## 2022-05-26 DIAGNOSIS — N179 Acute kidney failure, unspecified: Secondary | ICD-10-CM

## 2022-05-26 DIAGNOSIS — N289 Disorder of kidney and ureter, unspecified: Secondary | ICD-10-CM | POA: Insufficient documentation

## 2022-05-26 LAB — CMP (CANCER CENTER ONLY)
ALT: 32 U/L (ref 0–44)
AST: 20 U/L (ref 15–41)
Albumin: 4.4 g/dL (ref 3.5–5.0)
Alkaline Phosphatase: 121 U/L (ref 38–126)
Anion gap: 7 (ref 5–15)
BUN: 28 mg/dL — ABNORMAL HIGH (ref 6–20)
CO2: 27 mmol/L (ref 22–32)
Calcium: 11.3 mg/dL — ABNORMAL HIGH (ref 8.9–10.3)
Chloride: 105 mmol/L (ref 98–111)
Creatinine: 3.01 mg/dL (ref 0.61–1.24)
GFR, Estimated: 24 mL/min — ABNORMAL LOW (ref >60)
Glucose, Bld: 89 mg/dL (ref 70–99)
Potassium: 4.6 mmol/L (ref 3.5–5.1)
Sodium: 139 mmol/L (ref 135–145)
Total Bilirubin: 0.5 mg/dL (ref 0.3–1.2)
Total Protein: 8 g/dL (ref 6.5–8.1)

## 2022-05-26 LAB — CBC WITH DIFFERENTIAL (CANCER CENTER ONLY)
Abs Immature Granulocytes: 0.01 10*3/uL (ref 0.00–0.07)
Basophils Absolute: 0 10*3/uL (ref 0.0–0.1)
Basophils Relative: 1 %
Eosinophils Absolute: 0.3 10*3/uL (ref 0.0–0.5)
Eosinophils Relative: 5 %
HCT: 35.5 % — ABNORMAL LOW (ref 39.0–52.0)
Hemoglobin: 12 g/dL — ABNORMAL LOW (ref 13.0–17.0)
Immature Granulocytes: 0 %
Lymphocytes Relative: 19 %
Lymphs Abs: 1.1 10*3/uL (ref 0.7–4.0)
MCH: 31.5 pg (ref 26.0–34.0)
MCHC: 33.8 g/dL (ref 30.0–36.0)
MCV: 93.2 fL (ref 80.0–100.0)
Monocytes Absolute: 0.7 10*3/uL (ref 0.1–1.0)
Monocytes Relative: 11 %
Neutro Abs: 3.8 10*3/uL (ref 1.7–7.7)
Neutrophils Relative %: 64 %
Platelet Count: 215 10*3/uL (ref 150–400)
RBC: 3.81 MIL/uL — ABNORMAL LOW (ref 4.22–5.81)
RDW: 11.9 % (ref 11.5–15.5)
WBC Count: 5.9 10*3/uL (ref 4.0–10.5)
nRBC: 0 % (ref 0.0–0.2)

## 2022-05-26 LAB — TSH: TSH: 3.782 u[IU]/mL (ref 0.350–4.500)

## 2022-05-26 LAB — RETICULOCYTES
Immature Retic Fract: 5.5 % (ref 2.3–15.9)
RBC.: 3.85 MIL/uL — ABNORMAL LOW (ref 4.22–5.81)
Retic Count, Absolute: 59.3 10*3/uL (ref 19.0–186.0)
Retic Ct Pct: 1.5 % (ref 0.4–3.1)

## 2022-05-26 LAB — IRON AND IRON BINDING CAPACITY (CC-WL,HP ONLY)
Iron: 144 ug/dL (ref 45–182)
Saturation Ratios: 49 % — ABNORMAL HIGH (ref 17.9–39.5)
TIBC: 294 ug/dL (ref 250–450)
UIBC: 150 ug/dL (ref 117–376)

## 2022-05-26 LAB — LACTATE DEHYDROGENASE: LDH: 149 U/L (ref 98–192)

## 2022-05-26 LAB — SAVE SMEAR(SSMR), FOR PROVIDER SLIDE REVIEW

## 2022-05-26 LAB — FERRITIN: Ferritin: 539 ng/mL — ABNORMAL HIGH (ref 24–336)

## 2022-05-26 NOTE — Progress Notes (Signed)
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  HPI: Casey Yoder is a very nice 56 year old white male.  He served in the [place] [employer].  As such, is a true American hero.  I certainly enjoyed talking with him and his wife.  He is followed at the [place].  He was recently hospitalized with what appeared to be acute renal insufficiency.  He was seen by his family doctor.  He apparently was noted to have increased BUN and creatinine.  This subsequently got him admitted over to [facility].  He was admitted on 05/16/2022.  At that time, his BUN was 39 creatinine 3.82.  His calcium was quite high at 12.1.  He had normal LFTs.  He was a little bit anemic.  His white cell count 6.8.  Hemoglobin 9.1.  Platelet count 243,000.  He had a renal ultrasound done.  This showed some echogenic kidneys.  They are not enlarged.  He had a urinalysis that was done.  Urinalysis did not show any protein in the urine.  He had light chain studies done.  His Kappa light chain was elevated at 48 mg/L.  Lambda light chain was also elevated at 31 mg/L.  I think based on this, he was referred to with the [place] to see about the possibility of myeloma.  He had an SPEP that was done on 05/17/2022.  This did not show a monoclonal spike in his blood.  He had a bone survey that was done which was negative for any lytic lesions.  I must say that he had a really thorough work-up in the hospital.  Again, he is now referred to the [place].  He does have some occupational exposures when he was in the [employer].  He was in [facility].  He may have been exposed to chemicals while in [facility].  He has had no problems with smoking.  He does not drink.  He says there is nobody in the family who has any count of blood problems.  There has been no weight  loss or weight gain.  He has had no obvious change in bowel or bladder habits.  His appetite has been pretty good.  He is not a vegetarian.  He has had past surgery.  He has had bilateral carpal tunnel surgery.  Overall, I have to say that his performance status is probably ECOG 0.   Past Medical History:  Diagnosis Date   Bipolar 1 disorder ([place])    Chest pain    Obesity    OBSTRUCTIVE SLEEP APNEA 10/26/2009   Qualifier: Diagnosis of  By: Gwenette Greet MD, Armando Reichert    PTSD (post-traumatic stress disorder)   :   Past Surgical History:  Procedure Laterality Date   KNEE SURGERY  08/25/1984   WRIST SURGERY    :   Current Outpatient Medications:    buPROPion (WELLBUTRIN XL) 150 MG 24 hr tablet, Take 1 tablet (150 mg total) by mouth daily., Disp: 30 tablet, Rfl: 0   finasteride (PROSCAR) 5 MG tablet, Take 1 tablet (5 mg total) by mouth daily., Disp: 30 tablet, Rfl: 0   LAMOTRIGINE PO, Take 20 mg by mouth at bedtime., Disp: , Rfl:    lurasidone (LATUDA) 40 MG TABS tablet, Take 20 mg by mouth at bedtime., Disp: , Rfl:  MAGNESIUM PO, Take 1 tablet by mouth every evening., Disp: , Rfl:    tamsulosin (FLOMAX) 0.4 MG CAPS capsule, Take 0.4 mg by mouth daily., Disp: , Rfl:    vitamin B-12 (CYANOCOBALAMIN) 500 MCG tablet, Take 1 tablet by mouth daily., Disp: , Rfl: :  :  No Known Allergies:   Family History  Problem Relation Age of Onset   Hypertension Mother    Heart disease Maternal Grandfather 80  :   Social History   Socioeconomic History   Marital status: Married    Spouse name: Not on file   Number of children: 1   Years of education: Not on file   Highest education level: Not on file  Occupational History   Not on file  Tobacco Use   Smoking status: Never   Smokeless tobacco: Never  Vaping Use   Vaping Use: Never used  Substance and Sexual Activity   Alcohol use: No   Drug use: No   Sexual activity: Yes    Partners: Female  Other Topics Concern   Not on file   Social History Narrative   Not on file   Social Determinants of Health   Financial Resource Strain: Not on file  Food Insecurity: Not on file  Transportation Needs: Not on file  Physical Activity: Not on file  Stress: Not on file  Social Connections: Not on file  Intimate Partner Violence: Not on file  :  Review of Systems  Constitutional:  Positive for malaise/fatigue.  HENT: Negative.    Eyes: Negative.   Respiratory: Negative.    Cardiovascular: Negative.   Gastrointestinal: Negative.   Genitourinary: Negative.   Musculoskeletal: Negative.   Skin: Negative.   Neurological: Negative.   Endo/Heme/Allergies: Negative.   Psychiatric/Behavioral: Negative.       Exam: His vital signs show temperature of 98.  Pulse 74.  Blood pressure 120/82.  Weight is 175 pounds.  _0 @ Physical Exam Vitals reviewed.  HENT:     Head: Normocephalic and atraumatic.  Eyes:     Pupils: Pupils are equal, round, and reactive to light.  Cardiovascular:     Rate and Rhythm: Normal rate and regular rhythm.     Heart sounds: Normal heart sounds.  Pulmonary:     Effort: Pulmonary effort is normal.     Breath sounds: Normal breath sounds.  Abdominal:     General: Bowel sounds are normal.     Palpations: Abdomen is soft.  Musculoskeletal:        General: No tenderness or deformity. Normal range of motion.     Cervical back: Normal range of motion.  Lymphadenopathy:     Cervical: No cervical adenopathy.  Skin:    General: Skin is warm and dry.     Findings: No erythema or rash.  Neurological:     Mental Status: He is alert and oriented to person, place, and time.  Psychiatric:        Behavior: Behavior normal.        Thought Content: Thought content normal.        Judgment: Judgment normal.     Recent Labs    05/26/22 0853  WBC 5.9  HGB 12.0*  HCT 35.5*  PLT 215    Recent Labs    05/26/22 0853  NA 139  K 4.6  CL 105  CO2 27  GLUCOSE 89  BUN 28*  CREATININE  3.01*  CALCIUM 11.3*    Blood smear review: Normochromic and normocytic population  of red blood cells.  There are no nucleated red blood cells.  I see no rouleaux formation.  There is no immature myeloid or lymphoid cells.  There are no plasma cells.  Platelets are adequate in number and size.  Platelets are well granulated.  Pathology: None    Assessment and Plan: Casey Yoder is a very nice 56 year old white male.  Again, he served in the [place] [employer].  He has renal insufficiency.  It seems as if his kidneys are getting slowly better.  His creatinine is coming down slowly but surely.  I would be hard pressed to say that he has myeloma.  I would think that if he had myeloma, that one of his light chains would be high and 1 would be low.  That both would be high.  Typically when I see both being high, this is more of an inflammatory reaction.  We will see what her 24-hour urine shows.  This can certainly help [place] out.  Again, he had a very thorough work-up in the hospital.  I do not see that we have to do a bone marrow biopsy on him.  I do still think this is necessary right now.  I would think though that if the 24-hour urine does show monoclonal light chains, then a bone marrow biopsy would be reasonable.  Ultimately, I think he is probably going to need a kidney biopsy.  I suppose he could have glomerulonephritis.  He could have some type of glomerulonephropathy.  I would think that if there was myeloma, that he would have protein in his urine.  Again, we will see what a 24-hour urine shows.  He and his wife are both incredibly nice.  I really enjoyed seeing them.  They have a very strong faith.  I gave him a prayer blanket which he was very grateful for.  I will not make a follow-up appointment for him to get until we see what the 24-hour urine shows, in addition to his lab work.

## 2022-05-27 LAB — KAPPA/LAMBDA LIGHT CHAINS
Kappa free light chain: 54.6 mg/L — ABNORMAL HIGH (ref 3.3–19.4)
Kappa, lambda light chain ratio: 1.33 (ref 0.26–1.65)
Lambda free light chains: 41 mg/L — ABNORMAL HIGH (ref 5.7–26.3)

## 2022-05-27 LAB — IGG, IGA, IGM
IgA: 356 mg/dL (ref 90–386)
IgG (Immunoglobin G), Serum: 950 mg/dL (ref 603–1613)
IgM (Immunoglobulin M), Srm: 82 mg/dL (ref 20–172)

## 2022-05-27 LAB — ERYTHROPOIETIN: Erythropoietin: 4.5 m[IU]/mL (ref 2.6–18.5)

## 2022-05-27 LAB — PROTEIN ELECTROPHORESIS, SERUM, WITH REFLEX
A/G Ratio: 1.1 (ref 0.7–1.7)
Albumin ELP: 3.9 g/dL (ref 2.9–4.4)
Alpha-1-Globulin: 0.3 g/dL (ref 0.0–0.4)
Alpha-2-Globulin: 0.9 g/dL (ref 0.4–1.0)
Beta Globulin: 1.1 g/dL (ref 0.7–1.3)
Gamma Globulin: 1.1 g/dL (ref 0.4–1.8)
Globulin, Total: 3.4 g/dL (ref 2.2–3.9)
Total Protein ELP: 7.3 g/dL (ref 6.0–8.5)

## 2022-05-27 LAB — BETA 2 MICROGLOBULIN, SERUM: Beta-2 Microglobulin: 4.3 mg/L — ABNORMAL HIGH (ref 0.6–2.4)

## 2022-05-28 ENCOUNTER — Other Ambulatory Visit: Payer: Self-pay

## 2022-05-28 ENCOUNTER — Inpatient Hospital Stay: Payer: No Typology Code available for payment source

## 2022-05-28 DIAGNOSIS — N179 Acute kidney failure, unspecified: Secondary | ICD-10-CM

## 2022-05-30 ENCOUNTER — Encounter: Payer: Self-pay | Admitting: Hematology & Oncology

## 2022-05-30 ENCOUNTER — Inpatient Hospital Stay: Payer: No Typology Code available for payment source

## 2022-05-30 DIAGNOSIS — N289 Disorder of kidney and ureter, unspecified: Secondary | ICD-10-CM | POA: Diagnosis not present

## 2022-06-02 ENCOUNTER — Encounter: Payer: Self-pay | Admitting: Hematology & Oncology

## 2022-06-02 LAB — UPEP/TP, 24-HR URINE
Albumin, U: 25.6 %
Alpha 1, Urine: 10.1 %
Alpha 2, Urine: 12 %
Beta, Urine: 37.6 %
Gamma Globulin, Urine: 14.7 %
Total Protein, Urine-Ur/day: 263 mg/24 hr — ABNORMAL HIGH (ref 30–150)
Total Protein, Urine: 5.6 mg/dL
Total Volume: 4700

## 2022-06-03 ENCOUNTER — Other Ambulatory Visit: Payer: Self-pay

## 2022-06-03 ENCOUNTER — Emergency Department (HOSPITAL_BASED_OUTPATIENT_CLINIC_OR_DEPARTMENT_OTHER): Payer: No Typology Code available for payment source

## 2022-06-03 ENCOUNTER — Encounter (HOSPITAL_BASED_OUTPATIENT_CLINIC_OR_DEPARTMENT_OTHER): Payer: Self-pay | Admitting: Emergency Medicine

## 2022-06-03 ENCOUNTER — Emergency Department (HOSPITAL_BASED_OUTPATIENT_CLINIC_OR_DEPARTMENT_OTHER)
Admission: EM | Admit: 2022-06-03 | Discharge: 2022-06-03 | Disposition: A | Discharge status: Home / Self Care | Payer: No Typology Code available for payment source | Attending: Emergency Medicine | Admitting: Emergency Medicine

## 2022-06-03 DIAGNOSIS — D649 Anemia, unspecified: Secondary | ICD-10-CM | POA: Diagnosis not present

## 2022-06-03 DIAGNOSIS — R911 Solitary pulmonary nodule: Secondary | ICD-10-CM | POA: Diagnosis not present

## 2022-06-03 DIAGNOSIS — R109 Unspecified abdominal pain: Secondary | ICD-10-CM | POA: Diagnosis present

## 2022-06-03 DIAGNOSIS — K654 Sclerosing mesenteritis: Secondary | ICD-10-CM | POA: Diagnosis not present

## 2022-06-03 DIAGNOSIS — N289 Disorder of kidney and ureter, unspecified: Secondary | ICD-10-CM | POA: Insufficient documentation

## 2022-06-03 HISTORY — DX: Unspecified kidney failure: N19

## 2022-06-03 LAB — BASIC METABOLIC PANEL
Anion gap: 7 (ref 5–15)
BUN: 27 mg/dL — ABNORMAL HIGH (ref 6–20)
CO2: 27 mmol/L (ref 22–32)
Calcium: 9.9 mg/dL (ref 8.9–10.3)
Chloride: 105 mmol/L (ref 98–111)
Creatinine, Ser: 2.97 mg/dL — ABNORMAL HIGH (ref 0.61–1.24)
GFR, Estimated: 24 mL/min — ABNORMAL LOW (ref >60)
Glucose, Bld: 146 mg/dL — ABNORMAL HIGH (ref 70–99)
Potassium: 4.3 mmol/L (ref 3.5–5.1)
Sodium: 139 mmol/L (ref 135–145)

## 2022-06-03 LAB — URINALYSIS, ROUTINE W REFLEX MICROSCOPIC
Bilirubin Urine: NEGATIVE
Glucose, UA: NEGATIVE mg/dL
Hgb urine dipstick: NEGATIVE
Ketones, ur: NEGATIVE mg/dL
Leukocytes,Ua: NEGATIVE
Nitrite: NEGATIVE
Protein, ur: NEGATIVE mg/dL
Specific Gravity, Urine: 1.011 (ref 1.005–1.030)
pH: 5.5 (ref 5.0–8.0)

## 2022-06-03 LAB — CBC
HCT: 32.6 % — ABNORMAL LOW (ref 39.0–52.0)
Hemoglobin: 10.9 g/dL — ABNORMAL LOW (ref 13.0–17.0)
MCH: 31.4 pg (ref 26.0–34.0)
MCHC: 33.4 g/dL (ref 30.0–36.0)
MCV: 93.9 fL (ref 80.0–100.0)
Platelets: 211 10*3/uL (ref 150–400)
RBC: 3.47 MIL/uL — ABNORMAL LOW (ref 4.22–5.81)
RDW: 12.4 % (ref 11.5–15.5)
WBC: 6.4 10*3/uL (ref 4.0–10.5)
nRBC: 0 % (ref 0.0–0.2)

## 2022-06-03 MED ORDER — OXYCODONE-ACETAMINOPHEN 5-325 MG PO TABS: 1.0000 | ORAL_TABLET | Freq: Four times a day (QID) | ORAL | 0 refills | Status: DC | PRN

## 2022-06-03 MED ORDER — ONDANSETRON 8 MG PO TBDP: 8.0000 mg | ORAL_TABLET | Freq: Three times a day (TID) | ORAL | 0 refills | Status: DC | PRN

## 2022-06-03 MED ORDER — OXYCODONE-ACETAMINOPHEN 5-325 MG PO TABS
1.0000 | ORAL_TABLET | Freq: Once | ORAL | Status: DC
  Filled 2022-06-03: qty 1

## 2022-06-03 MED ORDER — ACETAMINOPHEN 500 MG PO TABS
1000.0000 mg | ORAL_TABLET | Freq: Once | ORAL | Status: AC
  Administered 2022-06-03: 1000 mg via ORAL
  Filled 2022-06-03: qty 2

## 2022-06-03 MED ORDER — ONDANSETRON 4 MG PO TBDP
8.0000 mg | ORAL_TABLET | Freq: Once | ORAL | Status: DC
  Filled 2022-06-03 (×2): qty 2

## 2022-06-03 NOTE — ED Provider Notes (Signed)
[facility] EMERGENCY DEPT Provider Note   CSN: [id number] Arrival date & time: 06/03/22  1350     History  Chief Complaint  Patient presents with   Flank Pain    Casey Yoder is a 56 y.o. male.   Flank Pain     Patient presents due to right flank pain x1 day.  Started acutely, its been constant.  Is worse when he leans to the right, better when he leans to the left.  No nausea, vomiting, dysuria, hematuria.  Denies any abdominal pain, chest pain or shortness of breath.  Was incidentally found to be in renal failure with a creatinine of 3 in September of this year, has been followed by Dr. Kathrene Bongo for this although they do not have a definitive reason for yet.  Home Medications Prior to Admission medications   Medication Sig Start Date End Date Taking? Authorizing Provider  buPROPion (WELLBUTRIN XL) 150 MG 24 hr tablet Take 1 tablet (150 mg total) by mouth daily. 05/19/22   Alfredo Martinez, MD  finasteride (PROSCAR) 5 MG tablet Take 1 tablet (5 mg total) by mouth daily. 05/19/22   Alfredo Martinez, MD  LAMOTRIGINE PO Take 20 mg by mouth at bedtime.    [provider]  lurasidone (LATUDA) 40 MG TABS tablet Take 20 mg by mouth at bedtime. 03/07/20   [provider]  MAGNESIUM PO Take 1 tablet by mouth every evening.    [provider]  ondansetron (ZOFRAN-ODT) 8 MG disintegrating tablet Take 1 tablet (8 mg total) by mouth every 8 (eight) hours as needed for nausea or vomiting. 06/03/22  Yes Theron Arista, PA-C  oxyCODONE-acetaminophen (PERCOCET/ROXICET) 5-325 MG tablet Take 1 tablet by mouth every 6 (six) hours as needed for severe pain. 06/03/22  Yes Theron Arista, PA-C  tamsulosin (FLOMAX) 0.4 MG CAPS capsule Take 0.4 mg by mouth daily. 04/14/22   [provider]  vitamin B-12 (CYANOCOBALAMIN) 500 MCG tablet Take 1 tablet by mouth daily. 01/04/20   [provider]      Allergies    Patient has no known allergies.     Review of Systems   Review of Systems  Genitourinary:  Positive for flank pain.    Physical Exam Updated Vital Signs BP 102/70 (BP Location: Right Arm)   Pulse 83   Temp 98.8 F (37.1 C) (Oral)   Resp 17   Ht 5\' 7"  (1.702 m)   Wt 79.4 kg   SpO2 99%   BMI 27.41 kg/m  Physical Exam Vitals and nursing note reviewed. Exam conducted with a chaperone present.  Constitutional:      Appearance: Normal appearance.  HENT:     Head: Normocephalic and atraumatic.  Eyes:     General: No scleral icterus.       Right eye: No discharge.        Left eye: No discharge.     Extraocular Movements: Extraocular movements intact.     Pupils: Pupils are equal, round, and reactive to light.  Cardiovascular:     Rate and Rhythm: Normal rate and regular rhythm.     Pulses: Normal pulses.     Heart sounds: Normal heart sounds. No murmur heard.    No friction rub. No gallop.  Pulmonary:     Effort: Pulmonary effort is normal. No respiratory distress.     Breath sounds: Normal breath sounds.  Abdominal:     General: Abdomen is flat. Bowel sounds are normal. There is no distension.  Palpations: Abdomen is soft.     Tenderness: There is no abdominal tenderness. There is right CVA tenderness.  Skin:    General: Skin is warm and dry.     Coloration: Skin is not jaundiced.  Neurological:     Mental Status: He is alert. Mental status is at baseline.     Coordination: Coordination normal.     ED Results / Procedures / Treatments   Labs (all labs ordered are listed, but only abnormal results are displayed) Labs Reviewed  CBC - Abnormal; Notable for the following components:      Result Value   RBC 3.47 (*)    Hemoglobin 10.9 (*)    HCT 32.6 (*)    All other components within normal limits  BASIC METABOLIC PANEL - Abnormal; Notable for the following components:   Glucose, Bld 146 (*)    BUN 27 (*)    Creatinine, Ser 2.97 (*)    GFR, Estimated 24 (*)    All other components within  normal limits  URINALYSIS, ROUTINE W REFLEX MICROSCOPIC - Abnormal; Notable for the following components:   Color, Urine COLORLESS (*)    All other components within normal limits    EKG None  Radiology CT Renal Stone Study  Result Date: 06/03/2022 CLINICAL DATA:  Right sided flank pain EXAM: CT ABDOMEN AND PELVIS WITHOUT CONTRAST TECHNIQUE: Multidetector CT imaging of the abdomen and pelvis was performed following the standard protocol without IV contrast. RADIATION DOSE REDUCTION: This exam was performed according to the departmental dose-optimization program which includes automated exposure control, adjustment of the mA and/or kV according to patient size and/or use of iterative reconstruction technique. COMPARISON:  None Available. FINDINGS: Lower chest: 5 mm right middle lobe pulmonary nodule on image 2/4and 5 mm right lower lobe pulmonary nodule on image 6/4. Hepatobiliary: Scattered tiny hypodense hepatic lesions are technically too small to accurately characterize but statistically likely to reflect cysts or hemangiomas. Gallbladder is unremarkable. No biliary ductal dilation. Pancreas: No pancreatic ductal dilation or evidence of acute inflammation. Spleen: No splenomegaly or focal splenic lesion. Adrenals/Urinary Tract: Bilateral adrenal glands appear normal. No hydronephrosis. Hypodense 2.6 cm left lower pole renal lesion measures fluid density consistent with a cyst and considered benign requiring no independent imaging follow-up no renal, ureteral or bladder calculi. Urinary bladder is unremarkable for degree of distension. Stomach/Bowel: No radiopaque enteric contrast material was administered. Stomach is distended with ingested material and gas without focal wall thickening. No pathologic dilation of small or large bowel. The appendix and terminal ileum appear normal. Moderate volume of formed stool throughout the colon suggestive of constipation. No evidence of acute bowel inflammation.  Vascular/Lymphatic: Normal caliber abdominal aorta. No pathologically enlarged abdominal or pelvic lymph nodes. Reproductive: Prostate is unremarkable. Other: Misty appearance of the mesentery with prominent mesenteric lymph nodes measuring up to 4-5 mm in short axis. Musculoskeletal: Multilevel degenerative changes spine. No acute osseous abnormality. IMPRESSION: 1. Misty appearance of the mesentery with prominent mesenteric lymph nodes measuring up to 4-5 mm in short axis, which is nonspecific but can be seen in the setting of mesenteric panniculitis . 2. Moderate volume of formed stool throughout the colon suggestive of constipation. 3. No renal, ureteral or bladder calculi. No hydronephrosis. 4. Right middle and lower lobe pulmonary nodules measuring up to 5 mm. Per Fleischner Society Guidelines, no routine follow-up imaging is recommended. These guidelines do not apply to immunocompromised patients and patients with cancer. Follow up in patients with significant comorbidities as  clinically warranted. For lung cancer screening, adhere to Lung-RADS guidelines. Reference: Radiology. 2017; 284(1):228-43. Electronically Signed   By: Maudry Mayhew M.D.   On: 06/03/2022 15:55    Procedures Procedures    Medications Ordered in ED Medications  acetaminophen (TYLENOL) tablet 1,000 mg (1,000 mg Oral Given 06/03/22 1532)    ED Course/ Medical Decision Making/ A&P                           Medical Decision Making Amount and/or Complexity of Data Reviewed Labs: ordered. Radiology: ordered.  Risk OTC drugs. Prescription drug management.   Patient presents due to right flank pain.  Differential includes but not limited to nephrolithiasis, pyelonephritis, UTI, infective uropathy, muscle strain, worsening renal function.  I reviewed external medical records including patient's previous labs to compare today's labs.  Patient has chronic renal impairment in the range Cr +/-3.0 last month.  He is being  followed by Dr. Kathrene Bongo with nephrology.  I ordered, viewed and interpreted laboratory work-up. -CBC without leukocytosis.  Stable anemia with a hemoglobin of 10.9. -BMP: creatinine is at baseline at 2.97, slight hyperglycemia at 146.  BUN is slightly elevated 27 likely in the setting of chronic renal disease. -UA is clear no signs of UTI.  I ordered, viewed and interpreted CT renal study.  Notable for mesenteric panniculitis which I think is likely the source of the pain.  No signs of pyelonephritis, nephrolithiasis.  There is also incidental findings of 5 mm nodules in the lungs which I discussed with the patient, no repeat imaging is recommended but I did advise him to follow-up with his PCP to confirm this.   -I agree with radiologist interpretation.  I reviewed patient's home medication list, no nephrotoxic agents.  I ordered Tylenol for the patient's pain, initially offered narcotics but patient declined which is reasonable.  I reevaluated the patient and pain is improved after the Tylenol.  Discussed findings with CT scan as documented above.  I also provided referral for gastroenterology.  He will continue following up with his nephrologist regarding his kidney function but given this is baseline no like any additional work-up is indicated.  I considered hospitalization but patient is well-appearing, tolerating p.o. and has great outpatient follow-up available.  Strict return precautions were discussed and he verbalized understanding.        Final Clinical Impression(s) / ED Diagnoses Final diagnoses:  Mesenteric panniculitis (HCC)  Pulmonary nodule    Rx / DC Orders ED Discharge Orders          Ordered    ondansetron (ZOFRAN-ODT) 8 MG disintegrating tablet  Every 8 hours PRN        06/03/22 1623    oxyCODONE-acetaminophen (PERCOCET/ROXICET) 5-325 MG tablet  Every 6 hours PRN        06/03/22 1623              Theron Arista, PA-C 06/03/22 1632    Benjiman Core, MD 06/03/22 2352

## 2022-06-03 NOTE — Telephone Encounter (Signed)
Called patient and informed patient per Dr.Ennever he does not have Myeloma, there was no M Spike detected which shows if he has myleoma or not. Informed him his total protein was slightly elevated because of his kidney failure. Mattson verbalized understanding and denies any other questions or concerns at this time.

## 2022-06-03 NOTE — ED Triage Notes (Signed)
Pt arrives to ED with c/o right sided flank pain that started today. Recent hx of renal failure.

## 2022-06-03 NOTE — Discharge Instructions (Addendum)
You are seen today in the emergency department for right flank pain.  Kidney function is roughly baseline at 2.97, urine is clear and your calcium is within normal limits.  Please follow-up with your primary regarding any of the laboratory work-up and for ED follow-up care.  You have mesenteric panniculitis which is usually an idiopathic inflammation in the mesenteric tissue which should resolve on its own.  Take Tylenol as well as the pain medicine for pain.  If you take the narcotic make sure to take the Zofran 15 minutes before to prevent nausea and vomiting.  Can also follow-up with a gastroenterologist regarding this finding to make sure it resolves, information above to establish care.  Continue following up with Dr. Moshe Cipro regarding her kidney function.  Return to the ED if you are unable to eat or drink, you have severe pain, new concerning symptoms.    FINDINGS:  Lower chest: 5 mm right middle lobe pulmonary nodule on image 2/4and  5 mm right lower lobe pulmonary nodule on image 6/4.    Hepatobiliary: Scattered tiny hypodense hepatic lesions are  technically too small to accurately characterize but statistically  likely to reflect cysts or hemangiomas. Gallbladder is unremarkable.  No biliary ductal dilation.    Pancreas: No pancreatic ductal dilation or evidence of acute  inflammation.    Spleen: No splenomegaly or focal splenic lesion.    Adrenals/Urinary Tract: Bilateral adrenal glands appear normal. No  hydronephrosis. Hypodense 2.6 cm left lower pole renal lesion  measures fluid density consistent with a cyst and considered benign  requiring no independent imaging follow-up no renal, ureteral or  bladder calculi. Urinary bladder is unremarkable for degree of  distension.    Stomach/Bowel: No radiopaque enteric contrast material was  administered. Stomach is distended with ingested material and gas  without focal wall thickening. No pathologic dilation of small or   large bowel. The appendix and terminal ileum appear normal. Moderate  volume of formed stool throughout the colon suggestive of  constipation. No evidence of acute bowel inflammation.    Vascular/Lymphatic: Normal caliber abdominal aorta. No  pathologically enlarged abdominal or pelvic lymph nodes.    Reproductive: Prostate is unremarkable.    Other: Misty appearance of the mesentery with prominent mesenteric  lymph nodes measuring up to 4-5 mm in short axis.    Musculoskeletal: Multilevel degenerative changes spine. No acute  osseous abnormality.    IMPRESSION:  1. Misty appearance of the mesentery with prominent mesenteric lymph  nodes measuring up to 4-5 mm in short axis, which is nonspecific but  can be seen in the setting of mesenteric panniculitis .  2. Moderate volume of formed stool throughout the colon suggestive  of constipation.  3. No renal, ureteral or bladder calculi. No hydronephrosis.  4. Right middle and lower lobe pulmonary nodules measuring up to 5  mm. Per Fleischner Society Guidelines, no routine follow-up imaging  is recommended. These guidelines do not apply to immunocompromised  patients and patients with cancer. Follow up in patients with  significant comorbidities as clinically warranted. For lung cancer  screening, adhere to Lung-RADS guidelines. Reference: Radiology.

## 2022-06-10 ENCOUNTER — Ambulatory Visit (INDEPENDENT_AMBULATORY_CARE_PROVIDER_SITE_OTHER): Payer: No Typology Code available for payment source | Admitting: Internal Medicine

## 2022-06-10 ENCOUNTER — Encounter: Payer: Self-pay | Admitting: Internal Medicine

## 2022-06-10 ENCOUNTER — Other Ambulatory Visit (INDEPENDENT_AMBULATORY_CARE_PROVIDER_SITE_OTHER): Payer: No Typology Code available for payment source

## 2022-06-10 VITALS — BP 100/84 | HR 82 | Ht 67.0 in | Wt 187.0 lb

## 2022-06-10 DIAGNOSIS — K654 Sclerosing mesenteritis: Secondary | ICD-10-CM

## 2022-06-10 LAB — SEDIMENTATION RATE: Sed Rate: 20 mm/hr (ref 0–20)

## 2022-06-10 LAB — C-REACTIVE PROTEIN: CRP: <1 mg/dL (ref 0.5–20.0)

## 2022-06-10 MED ORDER — LINACLOTIDE 145 MCG PO CAPS: 145.0000 ug | ORAL_CAPSULE | Freq: Every day | ORAL | 11 refills | Status: DC

## 2022-06-10 NOTE — Patient Instructions (Signed)
Your provider has requested that you go to the basement level for lab work before leaving today. Press "B" on the elevator. The lab is located at the first door on the left as you exit the elevator.  We have sent the following medications to your pharmacy for you to pick up at your convenience: Linzess 145 mcg daily.   Increase fiber intake.   We will reach out to you after speaking with Dr. Marin Olp.   The [place] [facility] providers would like to encourage you to use [facility] to communicate with providers for non-urgent requests or questions.  Due to long hold times on the telephone, sending your provider a message by [facility] may be a faster and more efficient way to get a response.  Please allow 48 business hours for a response.  Please remember that this is for non-urgent requests.   Due to recent changes in healthcare laws, you may see the results of your imaging and laboratory studies on MyChart before your provider has had a chance to review them.  We understand that in some cases there may be results that are confusing or concerning to you. Not all laboratory results come back in the same time frame and the provider may be waiting for multiple results in order to interpret others.  Please give [place] 48 hours in order for your provider to thoroughly review all the results before contacting the office for clarification of your results.

## 2022-06-10 NOTE — Progress Notes (Unsigned)
Chief Complaint: Mesenteric panniculitis  HPI : 56 year old male with history of bipolar disorder, obesity, OSA, CKD, and PTSD presents with mesenteric panniculitis  He was recently seen in the ED on 10/10 for right flank pain. At that time he had a CT scan that showed mesenteric panniculitis. The pain has been going on for the last 2 weeks. He can feel it in the right flank and it sometimes radiates to the right side. He has had the pain in the past as well. The pain has been a sore sensation. Being on his feet seems to make the pain worse. His pain has not been related to BMs or food intake. He does suffer from constipation, which has been going on over the last few months. Denies fevers or blood in the stools. Has lost 100 lbs over the last 8-12 months deliberately. Denies N&V. He was in the [employer] and was potentially exposed to chemicals. Last colonoscopy was a few months ago at the [place] with a few polyps. Denies family history of GI issues or cancers. Denies alcohol use or substance use. Endorses feeling tired and weak overall. Denies dysphagia, chest burning, or regurgitation. Denies prior EGD.   Wt Readings from Last 3 Encounters:  06/10/22 187 lb (84.8 kg)  06/03/22 175 lb (79.4 kg)  05/26/22 175 lb (79.4 kg)   Past Medical History:  Diagnosis Date   Bipolar 1 disorder ([place])    Chest pain    Colon polyps    Depression    Obesity    OBSTRUCTIVE SLEEP APNEA 10/26/2009   Qualifier: Diagnosis of  By: Gwenette Greet MD, Armando Reichert    PTSD (post-traumatic stress disorder)    Renal failure    Past Surgical History:  Procedure Laterality Date   KNEE SURGERY  08/25/1984   WRIST SURGERY     Family History  Problem Relation Age of Onset   Hypertension Mother    Heart disease Maternal Grandfather 59   Social History   Tobacco Use   Smoking status: Never   Smokeless tobacco: Never  Vaping Use   Vaping Use: Never used  Substance Use Topics   Alcohol use: No   Drug use: No   Current  Outpatient Medications  Medication Sig Dispense Refill   buPROPion (WELLBUTRIN XL) 150 MG 24 hr tablet Take 1 tablet (150 mg total) by mouth daily. 30 tablet 0   LAMOTRIGINE PO Take 20 mg by mouth at bedtime.     lurasidone (LATUDA) 40 MG TABS tablet Take 20 mg by mouth at bedtime.     MAGNESIUM PO Take 1 tablet by mouth every evening.     tamsulosin (FLOMAX) 0.4 MG CAPS capsule Take 0.4 mg by mouth daily.     vitamin B-12 (CYANOCOBALAMIN) 500 MCG tablet Take 1 tablet by mouth daily.     finasteride (PROSCAR) 5 MG tablet Take 1 tablet (5 mg total) by mouth daily. (Patient not taking: Reported on 06/10/2022) 30 tablet 0   ondansetron (ZOFRAN-ODT) 8 MG disintegrating tablet Take 1 tablet (8 mg total) by mouth every 8 (eight) hours as needed for nausea or vomiting. (Patient not taking: Reported on 06/10/2022) 20 tablet 0   oxyCODONE-acetaminophen (PERCOCET/ROXICET) 5-325 MG tablet Take 1 tablet by mouth every 6 (six) hours as needed for severe pain. (Patient not taking: Reported on 06/10/2022) 6 tablet 0   No current facility-administered medications for this visit.   No Known Allergies   Review of Systems: All systems reviewed and negative  except where noted in HPI.   Physical Exam: BP 100/84   Pulse 82   Ht 5' 7"  (1.702 m)   Wt 187 lb (84.8 kg)   BMI 29.29 kg/m  Constitutional: Pleasant,well-developed, male in no acute distress. HEENT: Normocephalic and atraumatic. Conjunctivae are normal. No scleral icterus. Cardiovascular: Normal rate, regular rhythm.  Pulmonary/chest: Effort normal and breath sounds normal. No wheezing, rales or rhonchi. Abdominal: Soft, nondistended, tender in the right flank. Bowel sounds active throughout. There are no masses palpable. No hepatomegaly. Extremities: No edema Neurological: Alert and oriented to person place and time. Skin: Skin is warm and dry. No rashes noted. Psychiatric: Normal mood and affect. Behavior is normal.  Labs 06/03/22: CBC with  low Hb of 10.9. BMP with elevated Cr of 2.97. Ferritin elevated at 539. Iron sat high at 49%. TSH nml. LDH nml. LFTs nml.   CT Renal Stone Study 06/03/22: IMPRESSION: 1. Misty appearance of the mesentery with prominent mesenteric lymph nodes measuring up to 4-5 mm in short axis, which is nonspecific but can be seen in the setting of mesenteric panniculitis . 2. Moderate volume of formed stool throughout the colon suggestive of constipation. 3. No renal, ureteral or bladder calculi. No hydronephrosis. 4. Right middle and lower lobe pulmonary nodules measuring up to 5 mm. Per Fleischner Society Guidelines, no routine follow-up imaging is recommended. These guidelines do not apply to immunocompromised patients and patients with cancer. Follow up in patients with significant comorbidities as clinically warranted. For lung cancer screening, adhere to Lung-RADS guidelines. Reference: Radiology. 2017; 284(1):228-43.  ASSESSMENT AND PLAN: Mesenteric Panniculitis Right flank pain Constipation Patient presented to the ED earlier this month for right flank pain and was recently noted on CT scan to potentially have mesenteric panniculitis with prominent mesenteric LNs. The CT scan also showed significant constipation. Because mesenteric panniculitis can be associated with malignancy such as lymphomas, I will touch base with his hem/onc physician Dr. Marin Olp to see if it would be worthwhile to see if a biopsy of one of the lymph nodes would be diagnostic. Will plan to get the report from his prior colonoscopy. I will also check some inflammatory markers today to see if they are elevated, which is seen in 80% of patients with mesenteric panniculitis. I will plan to start off by treating his constipation since this may be contributing to his right flank pain. Since the treatment for mesenteric panniculitis is quite extensive with months of therapy with prednisone and tamoxifen, would want to be certain that he  has mesenteric panniculitis prior to initiating treatment. Interestingly, patient has also developed anemia and worsening of his CKD over the last few months (unclear etiology). - Check CRP, ESR - Will touch base with Dr. Marin Olp - Obtain colonoscopy report from [place] 145 mcg QD - Encourage fiber intake  - RTC 2 months  Christia Reading, MD  I spent 65 minutes of time, including in depth chart review, independent review of results as outlined above, communicating results with the patient directly, face-to-face time with the patient, coordinating care, ordering studies and medications as appropriate, and documentation.

## 2022-06-13 ENCOUNTER — Telehealth: Payer: Self-pay | Admitting: Internal Medicine

## 2022-06-13 NOTE — Telephone Encounter (Signed)
Spoke to Dr. Annamaria Boots with [facility] regarding his CT scan to see if there was any LN that would be accessible for biopsy, and he stated that there were none that could be targeted at this time. He stated that the misty appearance of the mesentery can be quite nonspecific and sometimes be self-limiting. Interval CT scan in 3 months could be considered to see if there is resolution. In meantime will trial some constipation therapies to see if the patient's abdominal pain improves. I called the patient to let him know what our discussion was. Will CC Dr. Marin Olp so that he is updated.

## 2022-06-18 ENCOUNTER — Encounter: Payer: Self-pay | Admitting: Internal Medicine

## 2022-06-30 ENCOUNTER — Encounter: Payer: Self-pay | Admitting: *Deleted

## 2023-03-04 ENCOUNTER — Encounter (HOSPITAL_BASED_OUTPATIENT_CLINIC_OR_DEPARTMENT_OTHER): Payer: Self-pay | Admitting: Orthopedic Surgery

## 2023-03-11 ENCOUNTER — Encounter (HOSPITAL_COMMUNITY): Payer: Self-pay | Admitting: Anesthesiology

## 2023-03-12 NOTE — Anesthesia Preprocedure Evaluation (Deleted)
Anesthesia Evaluation    Reviewed: Allergy & Precautions, Patient's Chart, lab work & pertinent test results  Airway        Dental   Pulmonary sleep apnea and Continuous Positive Airway Pressure Ventilation           Cardiovascular      Neuro/Psych  PSYCHIATRIC DISORDERS Anxiety Depression Bipolar Disorder   PTSD   GI/Hepatic   Endo/Other    Renal/GU Renal InsufficiencyRenal disease     Musculoskeletal   Abdominal   Peds  Hematology   Anesthesia Other Findings   Reproductive/Obstetrics                              Anesthesia Physical Anesthesia Plan  ASA: 2  Anesthesia Plan: General   Post-op Pain Management: Ofirmev IV (intra-op)*, Toradol IV (intra-op)* and Precedex   Induction: Intravenous  PONV Risk Score and Plan: Treatment may vary due to age or medical condition, Ondansetron, Midazolam and Dexamethasone  Airway Management Planned: LMA  Additional Equipment: None  Intra-op Plan:   Post-operative Plan: Extubation in OR  Informed Consent:      Dental advisory given  Plan Discussed with:   Anesthesia Plan Comments:          Anesthesia Quick Evaluation

## 2023-04-24 ENCOUNTER — Ambulatory Visit (HOSPITAL_BASED_OUTPATIENT_CLINIC_OR_DEPARTMENT_OTHER)
Admission: RE | Admit: 2023-04-24 | Payer: No Typology Code available for payment source | Source: Home / Self Care | Admitting: Orthopedic Surgery

## 2023-04-24 HISTORY — DX: Other complications of anesthesia, initial encounter: T88.59XA

## 2023-04-24 SURGERY — ARTHROSCOPY, KNEE, WITH MEDIAL MENISCECTOMY
Anesthesia: Choice | Site: Knee | Laterality: Left

## 2023-09-28 ENCOUNTER — Encounter (HOSPITAL_BASED_OUTPATIENT_CLINIC_OR_DEPARTMENT_OTHER): Payer: Self-pay | Admitting: Pulmonary Disease

## 2023-09-28 ENCOUNTER — Ambulatory Visit (HOSPITAL_BASED_OUTPATIENT_CLINIC_OR_DEPARTMENT_OTHER): Payer: No Typology Code available for payment source | Admitting: Pulmonary Disease

## 2023-09-28 VITALS — BP 114/72 | HR 88 | Ht 67.0 in | Wt 214.3 lb

## 2023-09-28 DIAGNOSIS — R599 Enlarged lymph nodes, unspecified: Secondary | ICD-10-CM

## 2023-09-28 DIAGNOSIS — D869 Sarcoidosis, unspecified: Secondary | ICD-10-CM

## 2023-09-28 NOTE — Patient Instructions (Signed)
X CT chest wo con for sarcoidosis  X PFTs  Repeat CT abdomen after 1 month of treatment - we can order

## 2023-09-28 NOTE — Progress Notes (Signed)
Subjective:    Patient ID: Casey Yoder, male    DOB: 17-Jun-1966, 58 y.o.   MRN: [id number]  HPI  58 year old veteran referred from the [place] for evaluation of abdominal lymphadenopathy and multiple pulmonary nodules noted on CT abdomen. He underwent CT scan due to unexplained abdominal pain.  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  He complained of floaters and redness in his eyes, was referred by ophthalmology to retina specialist who felt that he had granulomas in his eyes he was started on prednisone 40 mg which he has been taking for a week. He reports intermittent dry cough, denies dyspnea or wheezing.  Of note he was admitted 04/2022 with hypercalcemia ranging from 10.6-11.3 and AKI with BUN/creatinine 38/3.7.  He was treated with bisphosphonate and over time this is gradually decreased to the last creatinine documented 08/05/2023 of 1.8.  He works as an Radio broadcast assistant, his spouse Casey Yoder is a Buyer, retail at [employer].     Significant tests/ events reviewed  09/04/2023 ACE level 113  CT abdomen/pelvis 08/2023 prominent mesenteric lymph nodes, enlarged porta hepatis and prominent retroperitoneal lymph nodes 8 mm subpleural nodule right middle lobe 7 mm nodule right lower lobe, 8 mm left lower lobe subpleural  CT abdomen/pelvis 05/2022 mesenteric lymphadenopathy, 5 mm right middle and lower lobe nodules  CT Cors 04/2021 right lower lobe 4 mm subpleural and right middle lobe 4 mm nodule versus lymph node  Past Medical History:  Diagnosis Date   Bipolar 1 disorder ([facility])    Chest pain    Colon polyps    Complication of anesthesia    hard to wakeup, had to stay overnight due to sedation   Depression    Obesity    OBSTRUCTIVE SLEEP APNEA 10/26/2009   Qualifier: Diagnosis of  By: Shelle Iron MD, Maree Krabbe    PTSD (post-traumatic stress disorder)    Renal failure     Past Surgical History:  Procedure Laterality Date    KNEE SURGERY  08/25/1984   WRIST SURGERY      No Known Allergies Social History   Socioeconomic History   Marital status: Married    Spouse name: Not on file   Number of children: 1   Years of education: Not on file   Highest education level: Not on file  Occupational History   Not on file  Tobacco Use   Smoking status: Never   Smokeless tobacco: Never  Vaping Use   Vaping status: Never Used  Substance and Sexual Activity   Alcohol use: No   Drug use: No   Sexual activity: Yes    Partners: Female  Other Topics Concern   Not on file  Social History Narrative   Not on file   Social Drivers of Health   Financial Resource Strain: Not on file  Food Insecurity: No Food Insecurity (08/31/2020)   Received from [facility], [facility]   Hunger Vital Sign    Worried About Running Out of Food in the Last Year: Never true    Ran Out of Food in the Last Year: Never true  Transportation Needs: Not on file  Physical Activity: Unknown (05/21/2021)   Received from [facility], [facility]   Exercise Vital Sign    Days of Exercise per Week: Patient declined    Minutes of Exercise per Session: Not on file  Stress: Not on file  Social Connections: Unknown (12/27/2021)   [facility]   Social Network    Social Network: Not on file  Intimate Partner Violence: Unknown (11/25/2021)   Received from [facility], [facility]   HITS    Physically Hurt: Not on file    Insult or Talk Down To: Not on file    Threaten Physical Harm: Not on file    Scream or Curse: Not on file    Family History  Problem Relation Age of Onset   Hypertension Mother    Heart disease Maternal Grandfather 50     Review of Systems Constitutional: negative for anorexia, fevers and sweats  Eyes: negative for irritation, positive for redness and visual disturbance  Ears, nose, mouth, throat, and face: negative for earaches, epistaxis, nasal congestion and sore throat   Respiratory: negative for c dyspnea on exertion, sputum and wheezing  Cardiovascular: negative for chest pain, dyspnea, lower extremity edema, orthopnea, palpitations and syncope  Gastrointestinal: negative for abdominal pain, constipation, diarrhea, melena, nausea and vomiting  Genitourinary:negative for dysuria, frequency and hematuria  Hematologic/lymphatic: negative for bleeding, easy bruising and lymphadenopathy  Musculoskeletal:negative for arthralgias, muscle weakness and stiff joints  Neurological: negative for coordination problems, gait problems, headaches and weakness  Endocrine: negative for diabetic symptoms including polydipsia, polyuria and weight loss     Objective:   Physical Exam  Gen. Pleasant, well-nourished, in no distress, normal affect ENT - no pallor,icterus, no post nasal drip Neck: No JVD, no thyromegaly, no carotid bruits Lungs: no use of accessory muscles, no dullness to percussion, clear without rales or rhonchi  Cardiovascular: Rhythm regular, heart sounds  normal, no murmurs or gallops, no peripheral edema Abdomen: soft and non-tender, no hepatosplenomegaly, BS normal. Musculoskeletal: No deformities, no cyanosis or clubbing Neuro:  alert, non focal        Assessment & Plan:

## 2023-09-28 NOTE — Assessment & Plan Note (Signed)
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  This also explains his previous presentation with AKI and hypercalcemia.  He has been started on 40 mg of prednisone by ophthalmology and we will follow along to see how he tolerates this.  Steroids/immunosuppressants may have to be titrated based on the eye disease. We will obtain CT chest to clarify presence of pulmonary nodules and lymphadenopathy. Will plan to repeat CT abdomen after treatment with steroids for 1 to 3 months for resolution of mesenteric lymphadenopathy.  I doubt that biopsy would be necessary . We will also obtain PFTs to establish baseline. I will discuss with ophthalmology if they want to direct care with steroids/immunosuppressants such as Imuran since his treatment will be directed towards his eye disease

## 2023-09-30 NOTE — Progress Notes (Signed)
 Office Visit Note  Patient: Casey Yoder             Date of Birth: 15-Nov-1965           MRN: [id number]             PCP: Patrcia Lynwood RAMAN, MD Referring: Millicent Redell POUR, MD Visit Date: 10/02/2023 Occupation: @GUAROCC @  Subjective:  Uveitis and sarcoidosis  History of Present Illness: Casey Yoder is a 58 y.o. male seen in consultation per request of Dr. 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  According the patient and September 2023 he was found to have low GFR and he was hospitalized.  He was referred to Dr. Prescilla.  According the patient no cause of chronic kidney disease was established.  He has been following up with Dr. Prescilla on a regular basis.  He states in February 2024 he noticed some visual changes and went to [facility] where he was seen by an ophthalmologist and was told that he had some hemorrhages in his eyes.  He was seen by Dr. Nonah at [facility] retina specialist.  He was diagnosed with uveitis.  He was started on prednisone eyedrops and later prednisone was added about 2 weeks ago.  He has been on prednisone 40 mg p.o. daily since then.  He also had a CT abdomen due to reflux issues which showed some nodules.  He was evaluated by Dr. Jude and a CT scan of the chest was done and the results are pending.  PFTs also pending.  He denies any history of shortness of breath.  He denies any history of rash or joint pain.  He had excruciating pain in his bilateral wrist joints few years back.  He had right wrist joint fusion by Dr. Camella in 2000 and left wrist joint fusion in 2023 in 2024.  He has had no discomfort since fusion.  He states that he did not notice any swelling in his wrist except for discomfort.  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  There is no history of any other joint swelling.  There is no family history of autoimmune disease.  There is no history of DVTs.  He has  siblings and children which are in good health.  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  He enjoys horse riding and yard work.  He was accompanied by his wife Lorn who is a respiratory therapist.    Activities of Daily Living:  Patient reports morning stiffness for 0 minutes.   Patient Denies nocturnal pain.  Difficulty dressing/grooming: Denies Difficulty climbing stairs: Denies Difficulty getting out of chair: Denies Difficulty using hands for taps, buttons, cutlery, and/or writing: Denies  Review of Systems  Constitutional:  Negative for fatigue.  HENT:  Negative for mouth sores, mouth dryness and nose dryness.   Eyes:  Positive for pain and redness. Negative for double vision, photophobia, discharge, itching, visual disturbance and dryness.  Respiratory:  Negative for shortness of breath and difficulty breathing.   Cardiovascular:  Negative for chest pain and palpitations.  Gastrointestinal:  Positive for constipation. Negative for blood in stool and diarrhea.  Endocrine: Negative for increased urination.  Genitourinary:  Negative for involuntary urination.  Musculoskeletal:  Negative for joint pain, gait problem, joint pain, joint swelling, myalgias, muscle weakness, morning stiffness, muscle tenderness and myalgias.  Skin:  Negative for color change, rash,  hair loss and sensitivity to sunlight.  Allergic/Immunologic: Negative for susceptible to infections.  Neurological:  Negative for dizziness, headaches and memory loss.  Hematological:  Negative for swollen glands.  Psychiatric/Behavioral:  Negative for depressed mood and sleep disturbance. The patient is not nervous/anxious.     PMFS History:  Patient Active Problem List   Diagnosis Date Noted   Sarcoidosis 09/28/2023   Constipation 05/18/2022   BPH (benign prostatic hyperplasia) 05/18/2022   Anemia 05/17/2022   Hypercalcemia 05/16/2022   AKI (acute kidney injury) (HCC)  05/16/2022   Bipolar 1 disorder (HCC)    OBSTRUCTIVE SLEEP APNEA 10/26/2009    Past Medical History:  Diagnosis Date   Bipolar 1 disorder (HCC)    Chest pain    Colon polyps    Complication of anesthesia    hard to wakeup, had to stay overnight due to sedation   Depression    Obesity    OBSTRUCTIVE SLEEP APNEA 10/26/2009   Qualifier: Diagnosis of  By: Corrie MD, Francis HERO    PTSD (post-traumatic stress disorder)    Renal failure     Family History  Problem Relation Age of Onset   Hypertension Mother    Glaucoma Father    Healthy Sister    Healthy Brother    Heart disease Maternal Grandfather 64   Healthy Daughter    Past Surgical History:  Procedure Laterality Date   KNEE SURGERY Left 08/25/1984   WRIST SURGERY     x2 on left wrist and x1 on right wrist   Social History   Social History Narrative   Not on file    There is no immunization history on file for this patient.   Objective: Vital Signs: BP 119/83 (BP Location: Right Arm, Patient Position: Sitting, Cuff Size: Normal)   Pulse 61   Resp 16   Ht 5' 7 (1.702 m)   Wt 213 lb 6.4 oz (96.8 kg)   BMI 33.42 kg/m    Physical Exam Vitals and nursing note reviewed.  Constitutional:      Appearance: He is well-developed.  HENT:     Head: Normocephalic and atraumatic.  Eyes:     Conjunctiva/sclera: Conjunctivae normal.     Pupils: Pupils are equal, round, and reactive to light.  Cardiovascular:     Rate and Rhythm: Normal rate and regular rhythm.     Heart sounds: Normal heart sounds.  Pulmonary:     Effort: Pulmonary effort is normal.     Breath sounds: Normal breath sounds.  Abdominal:     General: Bowel sounds are normal.     Palpations: Abdomen is soft.  Musculoskeletal:     Cervical back: Normal range of motion and neck supple.  Skin:    General: Skin is warm and dry.     Capillary Refill: Capillary refill takes less than 2 seconds.  Neurological:     Mental Status: He is alert and oriented to  person, place, and time.  Psychiatric:        Behavior: Behavior normal.      Musculoskeletal Exam: Cervical, thoracic and lumbar spine were in good range of motion.  He had no SI joint tenderness.  Shoulder joints, elbow joints were in good range of motion.  He has surgical scar over bilateral wrist joints with very limited flexion.  No synovitis was noted.  MCPs PIPs and DIPs were in good range of motion.  Bilateral PIP and DIP thickening was noted.  Hip joints and knee joints  in good range of motion without any warmth swelling or effusion.  There was no synovitis or tenderness over ankles.  Pes cavus and dorsal spurs were noted.  CDAI Exam: CDAI Score: -- Patient Global: --; Provider Global: -- Swollen: --; Tender: -- Joint Exam 10/02/2023   No joint exam has been documented for this visit   There is currently no information documented on the homunculus. Go to the Rheumatology activity and complete the homunculus joint exam.  Investigation: No additional findings.  Imaging: No results found.  Recent Labs: Lab Results  Component Value Date   WBC 6.4 06/03/2022   HGB 10.9 (L) 06/03/2022   PLT 211 06/03/2022   NA 139 06/03/2022   K 4.3 06/03/2022   CL 105 06/03/2022   CO2 27 06/03/2022   GLUCOSE 146 (H) 06/03/2022   BUN 27 (H) 06/03/2022   CREATININE 2.97 (H) 06/03/2022   BILITOT 0.5 05/26/2022   ALKPHOS 121 05/26/2022   AST 20 05/26/2022   ALT 32 05/26/2022   PROT 8.0 05/26/2022   ALBUMIN 4.4 05/26/2022   CALCIUM 9.9 06/03/2022   GFRAA >60 02/13/2017   September 18, 2023 [facility] at [facility]: Small irregular opacities in both lungs.  Infection or other underlying disease cannot be excluded unremarkable appearance of the cardiomediastinal silhouettes.  March 16, 2023 ANA negative, AST 25, ALT 29, GFR 36, creatinine 2.13 LDL 149, rheumatoid factor 7.2  September 04, 2023 CBC WBC 5.2, hemoglobin 14.6, platelets 235, ANCA MPO negative, PR-3 negative, c-ANCA  negative, p-ANCA negative, TB Gold negative, ACE 113, RPR nonreactive, lysozyme serum 10.7, HLA-B27 negative  October 01, 2023 CMP Cr 1.35, GFR 61, AST 15, ALT 21, calcium 9.2, BUN 11.9 Free kappa light chain high   Speciality Comments: No specialty comments available.  Procedures:  No procedures performed Allergies: Patient has no known allergies.   Assessment / Plan:     Visit Diagnoses: Sarcoidosis -patient was diagnosed with uveitis by Dr. Nonah at [facility] retina.  He was found to have scattered pulmonary nodule subcentimeter with mesenteric lymphadenopathy and possible liver lesions.  He also has history of chronic kidney disease and hypercalcemia.  ACE level was high.  CT chest is pending at this point.  Patient denies any history of shortness of breath.  He has been on prednisone 40 mg p.o. daily for the last 2 weeks by his ophthalmologist.  Plan: Protein / creatinine ratio, urine, Sedimentation rate  Uveitis-diagnosed by Dr.Weldy at [facility] retina specialist per patient.  He was given prednisolone eyedrops and was started on prednisone 40 mg p.o. daily 2 weeks ago.  He has noted some improvement in the symptoms.  I reviewed labs done by Dr. Nonah.September 04, 2023 CBC WBC 5.2, hemoglobin 14.6, platelets 235, ANCA MPO negative, PR-3 negative, c-ANCA negative, p-ANCA negative, TB Gold negative, ACE 113, RPR nonreactive, lysozyme serum 10.7, HLA-B27 negative.  Detail counseled regarding sarcoidosis and uveitis was provided.  Different treatment options and their side effects were discussed.  Side effects of long-term use of systemic steroids including increased risk of weight gain, hypertension, diabetes, cataracts, osteoporosis were discussed.  Treatment treatment options for the treatment of sarcoidosis and uveitis were discussed.  I would not be able to use methotrexate as patient has chronic kidney disease and low GFR.  I discussed the option of IV Remicade or subcutaneous Humira .  Patient  would prefer to be on subcutaneous Humira .  Side effects of Humira  were discussed.  Patient and his wife are in  agreement to proceed with Humira .  A handout was given and consent was taken.  We will apply for Humira .  Medication counseling:   TB Test: September 04, 2023 Hepatitis panel: Pending SPEP: Abnormal followed by Dr. Timmy Immunoglobulins: Pending  Chest x-ray: May 16, 2022.  CT scan of the chest is pending.  Does patient have diagnosis of heart failure?  No  Counseled patient that Humira  is a TNF blocking agent.  Reviewed Humira  dose of 40 mg every other week.  Counseled patient on purpose, proper use, and adverse effects of Humira .  Reviewed the most common adverse effects including infections, headache, and injection site reactions. Discussed that there is the possibility of an increased risk of malignancy but it is not well understood if this increased risk is due to the medication or the disease state.  Advised patient to get yearly dermatology exams due to risk of skin cancer.  Reviewed the importance of regular labs while on Humira  therapy.  Advised patient to get standing labs one month after starting Humira  then every 3 months.  Provided patient with standing lab orders.  Counseled patient that Humira  should be held prior to scheduled surgery.  Counseled patient to avoid live vaccines while on Humira .  Advised patient to get annual influenza vaccine and the pneumococcal vaccine as needed.  Provided patient with medication education material and answered all questions.  Patient voiced understanding.  Patient consented to Humira .  Will upload consent into the media tab.  Reviewed storage instructions of Humira .  Advised initial injection must be administered in office.    Patient voiced understanding.     High risk medication use - Plan: Hepatitis B core antibody, IgM, Hepatitis B surface antigen, Hepatitis C antibody, IgG, IgA, IgM  Long-term use of systemic steroids-patient has  been on prednisone 40 mg p.o. daily for the last 2 weeks.  I will check hemoglobin A1c.  Hypercalcemia-he has a longstanding history of hypercalcemia.  His most recent calcium level was normal.  Stage 3b chronic kidney disease (HCC)-his creatinine has been elevated since 2022.  Most recent creatinine was 4 October 01, 2023.  Patient will be following up with Dr. Prescilla this month.  There is question about renal involvement with sarcoidosis.  Status post fusion of wrist-bilateral by Dr. Camella - Wrist fusion 2020 and left wrist fusion 2023 and 2024 by Dr. Camella.  Patient was in the [employer].  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  Patient states he never developed swelling in his wrist joints but had severe pain.  He had a fusion of bilateral wrist joints by Dr. Camella.  He had no discomfort or inflammation today on the examination.  I have advised him to get the records forwarded from Dr. Mardella office.  Abnormal SPEP -patient has been evaluated by Dr. Timmy and had been followed by his PCP now.    Bipolar 1 disorder (HCC)-on lamotrigine   Stenosis of carotid artery, unspecified laterality  PTSD (post-traumatic stress disorder)  Obstructive sleep apnea on CPAP  Orders: Orders Placed This Encounter  Procedures   Protein / creatinine ratio, urine   Sedimentation rate   Hepatitis B core antibody, IgM   Hepatitis B surface antigen   Hepatitis C antibody   IgG, IgA, IgM   Hemoglobin A1c   No orders of the defined types were placed in this encounter.   Face-to-face time spent with patient was over  60 minutes. Greater than 50% of time was  spent in counseling and coordination of care.  Follow-Up Instructions: Return for Sarcoidosis, Uveitis.   Maya Nash, MD  Note - This record has been created using Animal nutritionist.  Chart creation errors have been sought, but may not always  have been located. Such creation errors do not reflect on   the standard of medical care.

## 2023-10-01 ENCOUNTER — Other Ambulatory Visit (HOSPITAL_BASED_OUTPATIENT_CLINIC_OR_DEPARTMENT_OTHER): Payer: Self-pay | Admitting: Pulmonary Disease

## 2023-10-01 ENCOUNTER — Ambulatory Visit
Admission: RE | Admit: 2023-10-01 | Discharge: 2023-10-01 | Disposition: A | Discharge status: Home / Self Care | Payer: No Typology Code available for payment source | Source: Ambulatory Visit | Attending: Pulmonary Disease | Admitting: Pulmonary Disease

## 2023-10-01 DIAGNOSIS — D869 Sarcoidosis, unspecified: Secondary | ICD-10-CM

## 2023-10-01 DIAGNOSIS — R599 Enlarged lymph nodes, unspecified: Secondary | ICD-10-CM

## 2023-10-02 ENCOUNTER — Telehealth: Payer: Self-pay | Admitting: Pharmacist

## 2023-10-02 ENCOUNTER — Ambulatory Visit: Payer: No Typology Code available for payment source | Attending: Rheumatology | Admitting: Rheumatology

## 2023-10-02 ENCOUNTER — Encounter: Payer: Self-pay | Admitting: Rheumatology

## 2023-10-02 VITALS — BP 119/83 | HR 61 | Resp 16 | Ht 67.0 in | Wt 213.4 lb

## 2023-10-02 DIAGNOSIS — Z981 Arthrodesis status: Secondary | ICD-10-CM

## 2023-10-02 DIAGNOSIS — F4310 Post-traumatic stress disorder, unspecified: Secondary | ICD-10-CM

## 2023-10-02 DIAGNOSIS — Z7952 Long term (current) use of systemic steroids: Secondary | ICD-10-CM

## 2023-10-02 DIAGNOSIS — D869 Sarcoidosis, unspecified: Secondary | ICD-10-CM | POA: Diagnosis not present

## 2023-10-02 DIAGNOSIS — R778 Other specified abnormalities of plasma proteins: Secondary | ICD-10-CM

## 2023-10-02 DIAGNOSIS — Z79899 Other long term (current) drug therapy: Secondary | ICD-10-CM

## 2023-10-02 DIAGNOSIS — F319 Bipolar disorder, unspecified: Secondary | ICD-10-CM

## 2023-10-02 DIAGNOSIS — I6529 Occlusion and stenosis of unspecified carotid artery: Secondary | ICD-10-CM

## 2023-10-02 DIAGNOSIS — G4733 Obstructive sleep apnea (adult) (pediatric): Secondary | ICD-10-CM

## 2023-10-02 DIAGNOSIS — N1832 Chronic kidney disease, stage 3b: Secondary | ICD-10-CM

## 2023-10-02 DIAGNOSIS — H209 Unspecified iridocyclitis: Secondary | ICD-10-CM | POA: Diagnosis not present

## 2023-10-02 DIAGNOSIS — R7309 Other abnormal glucose: Secondary | ICD-10-CM

## 2023-10-02 NOTE — Telephone Encounter (Addendum)
 Submitted a Prior Authorization request to [facility] for HUMIRA  uveitis starter kit via CoverMyMeds. Pending clinical questions to populate  Key: [id number]   Sherry Pennant, PharmD, MPH, BCPS, CPP Clinical Pharmacist (Rheumatology and Pulmonology)  ----- Message from [facility] Marissa G sent at 10/02/2023 10:40 AM EST ----- Please apply for Humira , per Dr. Dolphus.   Consent obtained and sent to the scan center.  (Patient's visits are under the [place] as we have to obtain authorization but patient also has [facility]- not sure which would be most beneficially for medication purposes). Thanks!

## 2023-10-02 NOTE — Patient Instructions (Signed)
 Adalimumab  Injection What is this medication? ADALIMUMAB  (ay da LIM yoo mab) treats autoimmune conditions, such as psoriasis, arthritis, Crohn disease, and ulcerative colitis. It works by slowing down an overactive immune system.  It may also be used to treat hidradenitis suppurativa (HS). HS is a condition that causes painful lumps under the skin in areas such as the armpits and groin. It belongs to a group of medications called TNF inhibitors. It is a monoclonal antibody. This medicine may be used for other purposes; ask your health care provider or pharmacist if you have questions. COMMON BRAND NAME(S): ABRILADA, AMJEVITA , CYLTEZO, HADLIMA , Hulio, Hulio PEN, Humira , HUMIRA  PEN, Hyrimoz , Idacio , Simlandi, Yuflyma , YUSIMRY  What should I tell my care team before I take this medication? They need to know if you have any of these conditions: Cancer Diabetes (high blood sugar) Having surgery Heart disease Hepatitis B Immune system problems Infections, such as tuberculosis (TB) or other bacterial, fungal, or viral infections Multiple sclerosis Recent or upcoming vaccine An unusual or allergic reaction to adalimumab , mannitol, latex, rubber, other medications, foods, dyes, or preservatives Pregnant or trying to get pregnant Breast-feeding How should I use this medication? This medication is injected under the skin. It may be given by your care team in a hospital or clinic setting. It may also be given at home. If you get this medication at home, you will be taught how to prepare and give it. Use exactly as directed. Take it as directed on the prescription label. Keep taking it unless your care team tells you to stop. This medication comes with INSTRUCTIONS FOR USE. Ask your pharmacist for directions on how to use this medication. Read the information carefully. Talk to your pharmacist or care team if you have questions. It is important that you put your used needles and syringes in a special sharps  container. Do not put them in a trash can. If you do not have a sharps container, call your pharmacist or care team to get one. A special MedGuide will be given to you by the pharmacist with each prescription and refill. If you are getting this medication in a hospital or clinic, a special MedGuide will be given to you before each treatment. Be sure to read this information carefully each time. Talk to your care team about the use of this medication in children. While it be prescribed for children as young as 2 years for selected conditions, precautions do apply. Overdosage: If you think you have taken too much of this medicine contact a poison control center or emergency room at once. NOTE: This medicine is only for you. Do not share this medicine with others. What if I miss a dose? If you get this medication at the hospital or clinic: it is important not to miss your dose. Call your care team if you are unable to keep an appointment. If you give yourself this medication at home: If you miss a dose, take it as soon as you can. If it is almost time for your next dose, take only that dose. Do not take double or extra doses. Call your care team with questions. What may interact with this medication? Do not take this medication with any of the following: Abatacept Anakinra Biologic medications, such as certolizumab, etanercept, golimumab, infliximab Live virus vaccines This medication may also interact with the following: Cyclosporine Theophylline Vaccines Warfarin This list may not describe all possible interactions. Give your health care provider a list of all the medicines, herbs, non-prescription drugs,  or dietary supplements you use. Also tell them if you smoke, drink alcohol, or use illegal drugs. Some items may interact with your medicine. What should I watch for while using this medication? Visit your care team for regular checks on your progress. Tell your care team if your symptoms do not  start to get better or if they get worse. You will be tested for tuberculosis (TB) before you start this medication. If your care team prescribes any medication for TB, you should start taking the TB medication before starting this medication. Make sure to finish the full course of TB medication. This medication may increase your risk of getting an infection. Call your care team for advice if you get a fever, chills, sore throat, or other symptoms of a cold or flu. Do not treat yourself. Try to avoid being around people who are sick. Talk to your care team about your risk of cancer. You may be more at risk for certain types of cancer if you take this medication. What side effects may I notice from receiving this medication? Side effects that you should report to your care team as soon as possible: Allergic reactions--skin rash, itching, hives, swelling of the face, lips, tongue, or throat Aplastic anemia--unusual weakness or fatigue, dizziness, headache, trouble breathing, increased bleeding or bruising Body pain, tingling, or numbness Heart failure--shortness of breath, swelling of the ankles, feet, or hands, sudden weight gain, unusual weakness or fatigue Infection--fever, chills, cough, sore throat, wounds that don't heal, pain or trouble when passing urine, general feeling of discomfort or being unwell Lupus-like syndrome--joint pain, swelling, or stiffness, butterfly-shaped rash on the face, rashes that get worse in the sun, fever, unusual weakness or fatigue Unusual bruising or bleeding Side effects that usually do not require medical attention (report to your care team if they continue or are bothersome): Headache Nausea Pain, redness, or irritation at injection site Runny or stuffy nose Sore throat Stomach pain This list may not describe all possible side effects. Call your doctor for medical advice about side effects. You may report side effects to FDA at 1-800-FDA-1088. Where should I  keep my medication? Keep out of the reach of children and pets. Store in the refrigerator. Do not freeze. Keep this medication in the original packaging until you are ready to take it. Protect from light. Get rid of any unused medication after the expiration date. This medication may be stored at room temperature for up to 14 days. Keep this medication in the original packaging. Protect from light. If it is stored at room temperature, get rid of any unused medication after 14 days or after it expires, whichever is first. To get rid of medications that are no longer needed or have expired: Take the medication to a medication take-back program. Check with your pharmacy or law enforcement to find a location. If you cannot return the medication, ask your pharmacist or care team how to get rid of this medication safely. NOTE: This sheet is a summary. It may not cover all possible information. If you have questions about this medicine, talk to your doctor, pharmacist, or health care provider.  2024 Elsevier/Gold Standard (2023-07-24 00:00:00)  Standing Labs We placed an order today for your standing lab work.   Please have your standing labs drawn in  1 month and every 3 months  Please have your labs drawn 2 weeks prior to your appointment so that the provider can discuss your lab results at your appointment, if possible.  Please note that you may see your imaging and lab results in MyChart before we have reviewed them. We will contact you once all results are reviewed. Please allow our office up to 72 hours to thoroughly review all of the results before contacting the office for clarification of your results.  WALK-IN LAB HOURS  Monday through Thursday from 8:00 am -12:30 pm and 1:00 pm-5:00 pm and Friday from 8:00 am-12:00 pm.  Patients with office visits requiring labs will be seen before walk-in labs.  You may encounter longer than normal wait times. Please allow additional time. Wait times  may be shorter on  Monday and Thursday afternoons.  We do not book appointments for walk-in labs. We appreciate your patience and understanding with our staff.   Labs are drawn by [facility]. Please bring your co-pay at the time of your lab draw.  You may receive a bill from [facility] for your lab work.  Please note if you are on Hydroxychloroquine and and an order has been placed for a Hydroxychloroquine level,  you will need to have it drawn 4 hours or more after your last dose.  If you wish to have your labs drawn at another location, please call the office 24 hours in advance so we can fax the orders.  The office is located at [address], [address], [place], [place] [address]   If you have any questions regarding directions or hours of operation,  please call [phone].   As a reminder, please drink plenty of water prior to coming for your lab work. Thanks!   Vaccines You are taking a medication(s) that can suppress your immune system.  The following immunizations are recommended: Flu annually Covid-19  RSV Td/Tdap (tetanus, diphtheria, pertussis) every 10 years Pneumonia (Prevnar 15 then Pneumovax 23 at least 1 year apart.  Alternatively, can take Prevnar 20 without needing additional dose) Shingrix: 2 doses from 4 weeks to 6 months apart  Please check with your PCP to make sure you are up to date.   If you have signs or symptoms of an infection or start antibiotics: First, call your PCP for workup of your infection. Hold your medication through the infection, until you complete your antibiotics, and until symptoms resolve if you take the following: Injectable medication (Actemra, Benlysta, Cimzia, Cosentyx, Enbrel, Humira , Kevzara, Orencia, Remicade, Simponi, Stelara, Taltz, Tremfya) Methotrexate Leflunomide (Arava) Mycophenolate (Cellcept) Earma, Olumiant, or Rinvoq  Please get an annual skin examination to screen for skin cancer.  Please use sunscreen and sun protection.

## 2023-10-02 NOTE — Telephone Encounter (Signed)
 Clinical questions completed. Patient likley going to have to take Hyrimoz  (since this is preferred on formulary)

## 2023-10-03 LAB — IGG, IGA, IGM
IgG (Immunoglobin G), Serum: 960 mg/dL (ref 600–1640)
IgM, Serum: 86 mg/dL (ref 50–300)
Immunoglobulin A: 296 mg/dL (ref 47–310)

## 2023-10-03 LAB — HEPATITIS C ANTIBODY: Hepatitis C Ab: NONREACTIVE

## 2023-10-03 LAB — HEMOGLOBIN A1C
Hgb A1c MFr Bld: 6.1 %{Hb} — ABNORMAL HIGH (ref <5.7)
Mean Plasma Glucose: 128 mg/dL
eAG (mmol/L): 7.1 mmol/L

## 2023-10-03 LAB — PROTEIN / CREATININE RATIO, URINE
Creatinine, Urine: 60 mg/dL (ref 20–320)
Protein/Creat Ratio: 217 mg/g{creat} — ABNORMAL HIGH (ref 25–148)
Protein/Creatinine Ratio: 0.217 mg/mg{creat} — ABNORMAL HIGH (ref 0.025–0.148)
Total Protein, Urine: 13 mg/dL (ref 5–25)

## 2023-10-03 LAB — HEPATITIS B SURFACE ANTIGEN: Hepatitis B Surface Ag: NONREACTIVE

## 2023-10-03 LAB — HEPATITIS B CORE ANTIBODY, IGM: Hep B C IgM: NONREACTIVE

## 2023-10-03 LAB — SEDIMENTATION RATE: Sed Rate: 22 mm/h — ABNORMAL HIGH (ref 0–20)

## 2023-10-03 NOTE — Progress Notes (Signed)
 Urine protein creatinine ratio was mildly elevated.  Sed rate is mildly elevated, hemoglobin A1c is elevated at 6.1 due to prednisone use.  Hepatitis B and hepatitis C nonreactive.  Immunoglobulins normal.  We should be able to start Humira  once approved.  He is forward results to his PCP, nephrologist and ophthalmologist.

## 2023-10-07 NOTE — Telephone Encounter (Signed)
Received notification from [facility] regarding a prior authorization for  HYRIMOZ . Authorization has been APPROVED from 10/02/2023 to 10/01/2024. Approval letter sent to scan center.  Unable to run test claim because patient must fill through [facility]: [phone]  Authorization # [id number]  Enrolled patient into Hyrimoz copay card:   Patient can be scheduled for Hyrimoz new start visit  Chesley Mires, PharmD, MPH, BCPS, CPP Clinical Pharmacist (Rheumatology and Pulmonology)

## 2023-10-08 NOTE — Telephone Encounter (Signed)
Patient scheduled for Hyrimoz new start on 10/12/2023. Patient denies infection, antibiotic use, and any upcoming surgeries  Chesley Mires, PharmD, MPH, BCPS, CPP Clinical Pharmacist (Rheumatology and Pulmonology)

## 2023-10-09 ENCOUNTER — Encounter (HOSPITAL_BASED_OUTPATIENT_CLINIC_OR_DEPARTMENT_OTHER): Payer: Self-pay | Admitting: Pulmonary Disease

## 2023-10-09 NOTE — Progress Notes (Signed)
Pt.notified

## 2023-10-12 ENCOUNTER — Ambulatory Visit: Payer: No Typology Code available for payment source | Admitting: Pharmacist

## 2023-10-12 NOTE — Telephone Encounter (Signed)
We do not have Hyrimoz samples in clinic. If patient wants to get started with another biosimilar that we have in clinic (Amjevita) we can do that. Patient states he wants to move forward with biosimilar and will call clinic to r/s if needed  Advised that we could get rx coordinated from pharmacy but would delay appt by one week possibly  Chesley Mires, PharmD, MPH, BCPS, CPP Clinical Pharmacist (Rheumatology and Pulmonology)

## 2023-10-12 NOTE — Patient Instructions (Incomplete)
Your next  HYRIMOZ (adalimumab-adaz)  dose is due on 10/20/2023 then every 2 weeks (11/03/2023 then 11/17/23, etc)  HOLD HYRIMOZ (adalimumab-adaz) if you have signs or symptoms of an infection. You can resume once you feel better or back to your baseline. HOLD HYRIMOZ (adalimumab-adaz) if you start antibiotics to treat an infection. HOLD HYRIMOZ (adalimumab-adaz) around the time of surgery/procedures. Your surgeon will be able to provide recommendations on when to hold BEFORE and when you are cleared to RESUME.  Pharmacy information: Your prescription will be shipped from [facility]. Their phone number is [phone] Please call to schedule shipment and confirm address. They will mail your medication to your home.  Cost information: Your copay should be affordable. If you call the pharmacy and it is not affordable, please double-check that they are billing through your copay card as secondary coverage. That copay card information is:  Labs are due in 1 month then every 3 months. Lab hours are from Monday to Thursday 8am-12:30pm and 1pm-5pm and Friday 8am-12pm. You do not need an appointment if you come for labs during these times. If you'd like to go to a [facility] or [facility] closer to home, please call our clinic 48 hours prior to lab date so we can release orders in a timely manner.  Stay up to date on all routine vaccines: influenza, pneumonia, COVID19, Shingles  How to manage an injection site reaction: Remember the 5 C's: COUNTER - leave on the counter at least 30 minutes but up to overnight to bring medication to room temperature. This may help prevent stinging COLD - place something cold (like an ice gel pack or cold water bottle) on the injection site just before cleansing with alcohol. This may help reduce pain CLARITIN - use Claritin (generic name is loratadine) for the first two weeks of treatment or the day of, the day before, and the day after injecting. This will help to  minimize injection site reactions CORTISONE CREAM - apply if injection site is irritated and itching CALL ME - if injection site reaction is bigger than the size of your fist, looks infected, blisters, or if you develop hives

## 2023-10-12 NOTE — Progress Notes (Unsigned)
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 He is accompanied by his wife. Patient currently takes prednisone 20mg  daily.  Patient running a fever or have signs/symptoms of infection? No  Patient currently on antibiotics for the treatment of infection? No  Patient have any upcoming invasive procedures/surgeries? No  Objective: CMP     Component Value Date/Time   NA 139 06/03/2022 1421   NA 143 05/16/2021 1040   K 4.3 06/03/2022 1421   CL 105 06/03/2022 1421   CO2 27 06/03/2022 1421   GLUCOSE 146 (H) 06/03/2022 1421   BUN 27 (H) 06/03/2022 1421   BUN 6 05/16/2021 1040   CREATININE 2.97 (H) 06/03/2022 1421   CREATININE 3.01 (HH) 05/26/2022 0853   CALCIUM 9.9 06/03/2022 1421   PROT 8.0 05/26/2022 0853   ALBUMIN 4.4 05/26/2022 0853   AST 20 05/26/2022 0853   ALT 32 05/26/2022 0853   ALKPHOS 121 05/26/2022 0853   BILITOT 0.5 05/26/2022 0853   GFRNONAA 24 (L) 06/03/2022 1421   GFRNONAA 24 (L) 05/26/2022 0853   GFRAA >60 02/13/2017 0015    CBC    Component Value Date/Time   WBC 6.4 06/03/2022 1421   RBC 3.47 (L) 06/03/2022 1421   HGB 10.9 (L) 06/03/2022 1421   HGB 12.0 (L) 05/26/2022 0853   HCT 32.6 (L) 06/03/2022 1421   PLT 211 06/03/2022 1421   PLT 215 05/26/2022 0853   MCV 93.9 06/03/2022 1421   MCH 31.4 06/03/2022 1421   MCHC 33.4 06/03/2022 1421   RDW 12.4 06/03/2022 1421   LYMPHSABS 1.1 05/26/2022 0853   MONOABS 0.7 05/26/2022 0853   EOSABS 0.3 05/26/2022 0853   BASOSABS 0.0 05/26/2022 0853    Baseline Immunosuppressant Therapy Labs TB GOLD   Hepatitis Panel    Latest Ref Rng & Units 10/02/2023   10:20 AM  Hepatitis  Hep B Surface Ag NON-REACTIVE NON-REACTIVE   Hep B IgM NON-REACTIVE NON-REACTIVE   Hep C Ab NON-REACTIVE NON-REACTIVE    HIV Lab Results  Component Value Date   HIV NON REACTIVE 02/12/2017   Immunoglobulins    Latest Ref Rng & Units 10/02/2023   10:20  AM  Immunoglobulin Electrophoresis  IgA  47 - 310 mg/dL 161   IgG 096 - 0,454 mg/dL 098   IgM 50 - 119 mg/dL 86    SPEP    Latest Ref Rng & Units 05/26/2022    8:53 AM  Serum Protein Electrophoresis  Total Protein 6.5 - 8.1 g/dL 8.0   Albumin 2.9 - 4.4 g/dL 3.9   Alpha-1 0.0 - 0.4 g/dL 0.3   Alpha-2 0.4 - 1.0 g/dL 0.9   Beta Globulin 0.7 - 1.3 g/dL 1.1   Gamma Globulin 0.4 - 1.8 g/dL 1.1    J4NW No results found for: "G6PDH" TPMT No results found for: "TPMT"   Chest x-ray: 10/01/23 - Cardiovascular: Unremarkable  Assessment/Plan:  Reviewed importance of holding Hyrimoz with signs/symptoms of an infections, if antibiotics are prescribed to treat an active infection, and with invasive procedures  Demonstrated proper injection technique with Hyrimoz demo device  Patient able to demonstrate proper injection technique using the teach back method.  Patient self injected in the right lower abdomen and left lower abdomen with:  Sample Medication: Amjevita 40mg /0.20mL pen injector x 2 pens = 80mg  NDC: [address]-65 Lot: [id number] Expiration: 06/24/2024  Patient tolerated well.  Observed for 30 mins in office for adverse  reaction. Patient denies itchiness and irritation at injection., No swelling or redness noted., and Reviewed injection site reaction management with patient verbally and printed information for review in AVS  Patient is to return in 1 month for labs and 6-8 weeks for follow-up appointment.  Standing orders for CBC/CMP placed.  TB gold will be monitored yearly.   Hyrimoz approved through insurance .   Rx sent to: [facility]: [phone].  Patient provided with pharmacy phone number and advised to call later this week to schedule shipment to home.  Patient will continue adalimumab-adaz 40mg /0.70mL subcut every 14 days as monotherapy.  Uveitis dosing 80 mg initial dose, followed by 40 mg every other week starting one week after initial dose.  All questions  encouraged and answered.  Instructed patient to call with any further questions or concerns.  Chesley Mires, PharmD, MPH, BCPS, CPP Clinical Pharmacist (Rheumatology and Pulmonology)  10/12/2023 10:41 AM

## 2023-10-13 ENCOUNTER — Ambulatory Visit: Payer: No Typology Code available for payment source | Attending: Rheumatology | Admitting: Pharmacist

## 2023-10-13 DIAGNOSIS — H209 Unspecified iridocyclitis: Secondary | ICD-10-CM

## 2023-10-13 DIAGNOSIS — Z7189 Other specified counseling: Secondary | ICD-10-CM

## 2023-10-13 DIAGNOSIS — Z79899 Other long term (current) drug therapy: Secondary | ICD-10-CM

## 2023-10-13 DIAGNOSIS — D869 Sarcoidosis, unspecified: Secondary | ICD-10-CM

## 2023-10-13 MED ORDER — ADALIMUMAB-ADAZ 40 MG/0.4ML ~~LOC~~ SOAJ: 40.0000 mg | SUBCUTANEOUS | 2 refills | Status: DC

## 2023-10-13 NOTE — Progress Notes (Deleted)
 Office Visit Note  Patient: Casey Yoder             Date of Birth: Jun 14, 1966           MRN: [id number]             PCP: Arlan Organ, MD Referring: Arlan Organ, MD Visit Date: 10/27/2023 Occupation: @GUAROCC @  Subjective:  No chief complaint on file.   History of Present Illness: Casey Yoder is a 58 y.o. male ***     Activities of Daily Living:  Patient reports morning stiffness for *** {minute/hour:19697}.   Patient {ACTIONS;DENIES/REPORTS:21021675::"Denies"} nocturnal pain.  Difficulty dressing/grooming: {ACTIONS;DENIES/REPORTS:21021675::"Denies"} Difficulty climbing stairs: {ACTIONS;DENIES/REPORTS:21021675::"Denies"} Difficulty getting out of chair: {ACTIONS;DENIES/REPORTS:21021675::"Denies"} Difficulty using hands for taps, buttons, cutlery, and/or writing: {ACTIONS;DENIES/REPORTS:21021675::"Denies"}  No Rheumatology ROS completed.   PMFS History:  Patient Active Problem List   Diagnosis Date Noted   Sarcoidosis 09/28/2023   Constipation 05/18/2022   BPH (benign prostatic hyperplasia) 05/18/2022   Anemia 05/17/2022   Hypercalcemia 05/16/2022   AKI (acute kidney injury) (HCC) 05/16/2022   Bipolar 1 disorder (HCC)    OBSTRUCTIVE SLEEP APNEA 10/26/2009    Past Medical History:  Diagnosis Date   Bipolar 1 disorder (HCC)    Chest pain    Colon polyps    Complication of anesthesia    hard to wakeup, had to stay overnight due to sedation   Depression    Obesity    OBSTRUCTIVE SLEEP APNEA 10/26/2009   Qualifier: Diagnosis of  By: Shelle Iron MD, Maree Krabbe    PTSD (post-traumatic stress disorder)    Renal failure     Family History  Problem Relation Age of Onset   Hypertension Mother    Glaucoma Father    Healthy Sister    Healthy Brother    Heart disease Maternal Grandfather 50   Healthy Daughter    Past Surgical History:  Procedure Laterality Date   KNEE SURGERY Left 08/25/1984   WRIST SURGERY     x2 on left wrist and x1 on right wrist    Social History   Social History Narrative   Not on file    There is no immunization history on file for this patient.   Objective: Vital Signs: There were no vitals taken for this visit.   Physical Exam   Musculoskeletal Exam: ***  CDAI Exam: CDAI Score: -- Patient Global: --; Provider Global: -- Swollen: --; Tender: -- Joint Exam 10/27/2023   No joint exam has been documented for this visit   There is currently no information documented on the homunculus. Go to the Rheumatology activity and complete the homunculus joint exam.  Investigation: No additional findings.  Imaging: CT CHEST ABDOMEN PELVIS WO CONTRAST Result Date: 10/08/2023 CLINICAL DATA:  Sarcoidosis. Acute kidney injury. Lung nodule and lymphadenopathy. EXAM: CT CHEST, ABDOMEN AND PELVIS WITHOUT CONTRAST TECHNIQUE: Multidetector CT imaging of the chest, abdomen and pelvis was performed following the standard protocol without IV contrast. RADIATION DOSE REDUCTION: This exam was performed according to the departmental dose-optimization program which includes automated exposure control, adjustment of the mA and/or kV according to patient size and/or use of iterative reconstruction technique. COMPARISON:  Multiple exams, including cardiac CT 05/21/2021 CT abdomen 06/03/2022 FINDINGS: CT CHEST FINDINGS Cardiovascular: Unremarkable Mediastinum/Nodes: Numerous scattered mostly small an upper normal sized mediastinal and hilar lymph nodes. Index right lower paratracheal node 1.0 cm in diameter on image 27 series 2, borderline prominent. Lungs/Pleura: Substantial peribronchovascular lymph nodes bilaterally along subpleural nodules in a fairly  classic distribution for sarcoidosis. Bilateral irregular airway thickening. We do not have comparisons for many of these nodules to be able to compare size, but in general the pattern is reassuring for sarcoid. In the area of overlap from 05/21/2021, the size and number of nodules is  generally increased. An index right middle lobe subpleural nodule currently measuring 1.0 by 0.8 cm on image 110 series 3 previously measured 0.7 by 0.5 cm on 05/21/2021. Musculoskeletal: Unremarkable CT ABDOMEN PELVIS FINDINGS Hepatobiliary: Unremarkable Pancreas: Unremarkable Spleen: Unremarkable Adrenals/Urinary Tract: 3.4 by 3.2 cm left kidney lower pole cyst anteriorly, Bosniak category 1. No further imaging workup of this lesion is indicated. Otherwise unremarkable. Stomach/Bowel: Mild prominence of stool from the splenic flexure to the rectum, cannot exclude constipation. Normal appendix. Jejunal folds generally unremarkable. Transverse duodenal fold somewhat indistinct but no mass like sarcoid involvement is identified in the stomach or duodenum. Vascular/Lymphatic: Small porta hepatis and retroperitoneal lymph nodes observed. 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 No pathologic mesenteric adenopathy. Reproductive: Unremarkable Other: No supplemental non-categorized findings. Musculoskeletal: Left foraminal impingement at L5-S1 due to intervertebral spurring. Suspected right subarticular lateral recess stenosis at L4-5 due to a partially gas-filled right synovial cyst. IMPRESSION: 1. Substantial peribronchovascular lymph nodes bilaterally along subpleural nodules in a fairly classic distribution for pulmonary sarcoidosis. In the area of overlap from 05/21/2021, the size and number of nodules is generally increased. 2. 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 3. Mild prominence of stool from the splenic flexure to the rectum, cannot exclude constipation. 4. Left foraminal impingement at L5-S1 due to intervertebral spurring. Suspected right subarticular lateral  recess stenosis at L4-5 due to a partially gas-filled right synovial cyst. Electronically Signed   By: Gaylyn Rong M.D.   On: 10/08/2023 20:55    Recent Labs: Lab Results  Component Value Date   WBC 6.4 06/03/2022   HGB 10.9 (L) 06/03/2022   PLT 211 06/03/2022   NA 139 06/03/2022   K 4.3 06/03/2022   CL 105 06/03/2022   CO2 27 06/03/2022   GLUCOSE 146 (H) 06/03/2022   BUN 27 (H) 06/03/2022   CREATININE 2.97 (H) 06/03/2022   BILITOT 0.5 05/26/2022   ALKPHOS 121 05/26/2022   AST 20 05/26/2022   ALT 32 05/26/2022   PROT 8.0 05/26/2022   ALBUMIN 4.4 05/26/2022   CALCIUM 9.9 06/03/2022   GFRAA >60 02/13/2017    Speciality Comments: No specialty comments available.  Procedures:  No procedures performed Allergies: Patient has no known allergies.   Assessment / Plan:     Visit Diagnoses: No diagnosis found.  Orders: No orders of the defined types were placed in this encounter.  No orders of the defined types were placed in this encounter.   Face-to-face time spent with patient was *** minutes. Greater than 50% of time was spent in counseling and coordination of care.  Follow-Up Instructions: No follow-ups on file.   Pollyann Savoy, MD  Note - This record has been created using Animal nutritionist.  Chart creation errors have been sought, but may not always  have been located. Such creation errors do not reflect on  the standard of medical care.

## 2023-10-27 ENCOUNTER — Ambulatory Visit: Payer: No Typology Code available for payment source | Admitting: Rheumatology

## 2023-10-27 DIAGNOSIS — N1832 Chronic kidney disease, stage 3b: Secondary | ICD-10-CM

## 2023-10-27 DIAGNOSIS — Z981 Arthrodesis status: Secondary | ICD-10-CM

## 2023-10-27 DIAGNOSIS — H209 Unspecified iridocyclitis: Secondary | ICD-10-CM

## 2023-10-27 DIAGNOSIS — F319 Bipolar disorder, unspecified: Secondary | ICD-10-CM

## 2023-10-27 DIAGNOSIS — I6529 Occlusion and stenosis of unspecified carotid artery: Secondary | ICD-10-CM

## 2023-10-27 DIAGNOSIS — G4733 Obstructive sleep apnea (adult) (pediatric): Secondary | ICD-10-CM

## 2023-10-27 DIAGNOSIS — D869 Sarcoidosis, unspecified: Secondary | ICD-10-CM

## 2023-10-27 DIAGNOSIS — F4310 Post-traumatic stress disorder, unspecified: Secondary | ICD-10-CM

## 2023-10-27 DIAGNOSIS — Z7952 Long term (current) use of systemic steroids: Secondary | ICD-10-CM

## 2023-10-27 DIAGNOSIS — Z79899 Other long term (current) drug therapy: Secondary | ICD-10-CM

## 2023-10-27 DIAGNOSIS — R778 Other specified abnormalities of plasma proteins: Secondary | ICD-10-CM

## 2023-10-30 NOTE — Progress Notes (Signed)
 Office Visit Note  Patient: Casey Yoder             Date of Birth: 1966/06/23           MRN: [id number]             PCP: Arlan Organ, MD Referring: Arlan Organ, MD Visit Date: 11/13/2023 Occupation: @GUAROCC @  Subjective:  Patient management  History of Present Illness: Casey Yoder is a 58 y.o. male with sarcoidosis, and uveitis returns today for a follow-up visit.  He started Humira 40 mg subcu every other week on October 14, 2023.  He states he has been tolerating Humira well although he does not like taking injections.  He had no interruption in the treatment.  He denies any side effects from the medication.  He denies any shortness of breath.  He was seen by Dr. Vassie Loll recently and his PFTs were stable.  Patient was also seen by his ophthalmologist and was taken off oral prednisone.  He has not had  flares of uveitis.  He denies any shortness of breath, joint pain or joint swelling.    Activities of Daily Living:  Patient reports morning stiffness for 45 minutes.   Patient Denies nocturnal pain.  Difficulty dressing/grooming: Denies Difficulty climbing stairs: Denies Difficulty getting out of chair: Denies Difficulty using hands for taps, buttons, cutlery, and/or writing: Denies  Review of Systems  Constitutional:  Negative for fatigue.  HENT:  Negative for mouth sores and mouth dryness.   Eyes:  Negative for dryness.  Respiratory:  Negative for shortness of breath.   Cardiovascular:  Negative for chest pain and palpitations.  Gastrointestinal:  Positive for constipation. Negative for blood in stool and diarrhea.  Endocrine: Positive for increased urination.  Genitourinary:  Negative for involuntary urination.  Musculoskeletal:  Positive for morning stiffness. Negative for joint pain, gait problem, joint pain, joint swelling, myalgias, muscle weakness, muscle tenderness and myalgias.  Skin:  Negative for color change, rash, hair loss and sensitivity to  sunlight.  Allergic/Immunologic: Negative for susceptible to infections.  Neurological:  Negative for dizziness and headaches.  Hematological:  Negative for swollen glands.  Psychiatric/Behavioral:  Positive for depressed mood. Negative for sleep disturbance. The patient is not nervous/anxious.     PMFS History:  Patient Active Problem List   Diagnosis Date Noted   Uveitis 10/13/2023   Sarcoidosis 09/28/2023   Constipation 05/18/2022   BPH (benign prostatic hyperplasia) 05/18/2022   Anemia 05/17/2022   Hypercalcemia 05/16/2022   AKI (acute kidney injury) (HCC) 05/16/2022   Bipolar 1 disorder (HCC)    OBSTRUCTIVE SLEEP APNEA 10/26/2009    Past Medical History:  Diagnosis Date   Bipolar 1 disorder (HCC)    Chest pain    Colon polyps    Complication of anesthesia    hard to wakeup, had to stay overnight due to sedation   Depression    Obesity    OBSTRUCTIVE SLEEP APNEA 10/26/2009   Qualifier: Diagnosis of  By: Shelle Iron MD, Maree Krabbe    PTSD (post-traumatic stress disorder)    Renal failure     Family History  Problem Relation Age of Onset   Hypertension Mother    Glaucoma Father    Healthy Sister    Healthy Brother    Heart disease Maternal Grandfather 48   Healthy Daughter    Past Surgical History:  Procedure Laterality Date   KNEE SURGERY Left 08/25/1984   WRIST SURGERY     x2 on left  wrist and x1 on right wrist   Social History   Social History Narrative   Not on file    There is no immunization history on file for this patient.   Objective: Vital Signs: BP 119/78 (BP Location: Left Arm, Patient Position: Sitting, Cuff Size: Normal)   Pulse 72   Resp 15   Ht 5\' 7"  (1.702 m)   Wt 216 lb (98 kg)   BMI 33.83 kg/m    Physical Exam Vitals and nursing note reviewed.  Constitutional:      Appearance: He is well-developed.  HENT:     Head: Normocephalic and atraumatic.  Eyes:     Conjunctiva/sclera: Conjunctivae normal.     Pupils: Pupils are equal,  round, and reactive to light.  Cardiovascular:     Rate and Rhythm: Normal rate and regular rhythm.     Heart sounds: Normal heart sounds.  Pulmonary:     Effort: Pulmonary effort is normal.     Breath sounds: Normal breath sounds.  Abdominal:     General: Bowel sounds are normal.     Palpations: Abdomen is soft.  Musculoskeletal:     Cervical back: Normal range of motion and neck supple.  Skin:    General: Skin is warm and dry.     Capillary Refill: Capillary refill takes less than 2 seconds.  Neurological:     Mental Status: He is alert and oriented to person, place, and time.  Psychiatric:        Behavior: Behavior normal.      Musculoskeletal Exam: Cervical, thoracic and lumbar spine were in good range of motion.  He had no SI joint tenderness.  Shoulders, elbows, joints, MCPs PIPs and DIPs with good range of motion with no synovitis.  He had limited flexion of bilateral wrist joints due to previous surgery.  PIP and DIP thickening was noted.  Hip joints and knee joints with good range of motion without any warmth swelling or effusion.  There was no tenderness over ankles or MTPs.  CDAI Exam: CDAI Score: -- Patient Global: --; Provider Global: -- Swollen: --; Tender: -- Joint Exam 11/13/2023   No joint exam has been documented for this visit   There is currently no information documented on the homunculus. Go to the Rheumatology activity and complete the homunculus joint exam.  Investigation: No additional findings.  Imaging: No results found.   Recent Labs: Lab Results  Component Value Date   WBC 6.4 06/03/2022   HGB 10.9 (L) 06/03/2022   PLT 211 06/03/2022   NA 139 06/03/2022   K 4.3 06/03/2022   CL 105 06/03/2022   CO2 27 06/03/2022   GLUCOSE 146 (H) 06/03/2022   BUN 27 (H) 06/03/2022   CREATININE 2.97 (H) 06/03/2022   BILITOT 0.5 05/26/2022   ALKPHOS 121 05/26/2022   AST 20 05/26/2022   ALT 32 05/26/2022   PROT 8.0 05/26/2022   ALBUMIN 4.4 05/26/2022    CALCIUM 9.9 06/03/2022   GFRAA >60 02/13/2017   October 02, 2023 urine protein creatinine ratio elevated at 217, immunoglobulins normal, hemoglobin A1c 6.1, sed rate 22, hepatitis B-, hepatitis C negative  September 04, 2023 CBC WBC 5.2, hemoglobin 14.6, platelets 235, ANCA MPO negative, PR-3 negative, c-ANCA negative, p-ANCA negative, TB Gold negative, ACE 113, RPR nonreactive, lysozyme serum 10.7, HLA-B27 negative   October 01, 2023 CMP Cr 1.35, GFR 61, AST 15, ALT 21, calcium 9.2, BUN 11.9 Free kappa light chain high  Speciality Comments: Adalimumab  started 10/14/23 (adalimumab-adaz per insurance)  Procedures:  No procedures performed Allergies: Patient has no known allergies.   Assessment / Plan:     Visit Diagnoses: Sarcoidosis - Scattered pulmonary nodules, mesenteric lymphadenopathy and possible liver lesions.  Patient started Humira on October 14, 2023.  He has been tolerating Humira well.  He had an appointment with Dr. Vassie Loll and his PFTs were stable.  He has been off prednisone now.  He denies any increased shortness of breath.  Uveitis - dxd by Dr. Essie Hart at [facility] retina specialist.  Treated with oral prednisone and prednisolone eyedrops.  He is off oral prednisone now.  He will have follow-up appointment with Dr. Essie Hart.  No conjunctival injection was noted on the examination today.  High risk medication use - Humira 40 mg subcu every other week, started on October 13, 2023.  He had no interruption in the treatment.  He has been taking Humira on a regular basis.  Will check labs CBC with differential and CMP with GFR today.  He was advised to get labs every 3 months.  Information for immunization was placed in the 80s.  He was advised to hold Humira if he develops an infection resume after the infection resolves.  TB Gold was negative on September 04, 2023.  Annual skin examination to screen for skin cancer was advised.  Use of sunscreen and sun protection was  discussed.  Hypercalcemia-history of hypercalcemia in the past.  Last calcium level was 9.2 which was normal on October 01, 2023.  Stage 3b chronic kidney disease (HCC) - Elevated creatinine since 2022.  He is followed by Dr. Kathrene Bongo.  October 01, 2023 creatinine was 1.35 and GFR 61.  Status post fusion of wrist-bilateral by Dr. Amanda Pea - 2020, 2023 and 2024.  Abnormal SPEP - Followed by Dr. Myna Hidalgo and his PCP.  PTSD (post-traumatic stress disorder)  Bipolar 1 disorder (HCC)  Stenosis of carotid artery, unspecified laterality  Obstructive sleep apnea on CPAP  Orders: Orders Placed This Encounter  Procedures   CBC with Differential/Platelet   COMPLETE METABOLIC PANEL WITH GFR   No orders of the defined types were placed in this encounter.    Follow-Up Instructions: Return in about 3 months (around 02/13/2024) for Sarcoidosis.   Pollyann Savoy, MD  Note - This record has been created using Animal nutritionist.  Chart creation errors have been sought, but may not always  have been located. Such creation errors do not reflect on  the standard of medical care.

## 2023-11-03 ENCOUNTER — Ambulatory Visit (HOSPITAL_BASED_OUTPATIENT_CLINIC_OR_DEPARTMENT_OTHER): Payer: No Typology Code available for payment source | Admitting: Pulmonary Disease

## 2023-11-03 ENCOUNTER — Encounter (HOSPITAL_BASED_OUTPATIENT_CLINIC_OR_DEPARTMENT_OTHER): Payer: Self-pay | Admitting: Pulmonary Disease

## 2023-11-03 VITALS — BP 120/72 | HR 60 | Ht 68.0 in | Wt 217.4 lb

## 2023-11-03 DIAGNOSIS — D860 Sarcoidosis of lung: Secondary | ICD-10-CM

## 2023-11-03 DIAGNOSIS — D869 Sarcoidosis, unspecified: Secondary | ICD-10-CM | POA: Diagnosis not present

## 2023-11-03 LAB — PULMONARY FUNCTION TEST
DL/VA % pred: 98 %
DL/VA: 4.27 ml/min/mmHg/L
DLCO cor % pred: 101 %
DLCO cor: 26.81 ml/min/mmHg
DLCO unc % pred: 101 %
DLCO unc: 26.81 ml/min/mmHg
FEF 25-75 Post: 3.42 L/s
FEF 25-75 Pre: 2.44 L/s
FEF2575-%Change-Post: 40 %
FEF2575-%Pred-Post: 116 %
FEF2575-%Pred-Pre: 82 %
FEV1-%Change-Post: 8 %
FEV1-%Pred-Post: 94 %
FEV1-%Pred-Pre: 86 %
FEV1-Post: 3.26 L
FEV1-Pre: 3 L
FEV1FVC-%Change-Post: 2 %
FEV1FVC-%Pred-Pre: 101 %
FEV6-%Change-Post: 6 %
FEV6-%Pred-Post: 94 %
FEV6-%Pred-Pre: 88 %
FEV6-Post: 4.1 L
FEV6-Pre: 3.85 L
FEV6FVC-%Pred-Post: 104 %
FEV6FVC-%Pred-Pre: 104 %
FVC-%Change-Post: 6 %
FVC-%Pred-Post: 90 %
FVC-%Pred-Pre: 84 %
FVC-Post: 4.1 L
FVC-Pre: 3.85 L
Post FEV1/FVC ratio: 80 %
Post FEV6/FVC ratio: 100 %
Pre FEV1/FVC ratio: 78 %
Pre FEV6/FVC Ratio: 100 %
RV % pred: 152 %
RV: 3.17 L
TLC % pred: 109 %
TLC: 7.19 L

## 2023-11-03 NOTE — Progress Notes (Signed)
 Subjective:    Patient ID: Casey Yoder, male    DOB: 17-May-1966, 58 y.o.   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 This also explained his previous presentation with AKI and hypercalcemia. He has been started on 40 mg of prednisone    PMH :  admitted 04/2022 with hypercalcemia ranging from 10.6-11.3 and AKI with BUN/creatinine 38/3.7.  He was treated with bisphosphonate Cr decreased 08/05/2023 of 1.8.   He works as an Radio broadcast assistant, his spouse Casey Yoder is a Buyer, retail at [employer].  1 month follow-up visit. Reviewed rheumatology consultation   The patient, a 58 year old with a history of sarcoidosis, presents for a follow-up visit. The sarcoidosis has affected his lungs, lymph glands, eyes, and caused high calcium levels. He reports that he has been tolerating Humira well, with only minor cramping as a side effect. He has also been on prednisone and recently had steroid pellets inserted into his eyes to manage the ocular involvement of the disease. He expresses concern about his eyesight, but otherwise reports no significant symptoms. He works full-time for the state in [place] and also does side work as a Surveyor, minerals. His calcium levels have normalized, and his creatinine levels have improved. Despite the presence of sarcoidosis in his lungs, his lung function is good, and the disease is not severe in this area.      Significant tests/ events reviewed   PFTs 10/2023 normal CT chest /A/P 09/2023 >> Substantial peribronchovascular lymph nodes bilaterally along subpleural nodules in a fairly classic distribution for pulmonary sarcoidosis, increased compared to 04/2021 09/04/2023 ACE level 113   CT abdomen/pelvis 08/2023 prominent mesenteric lymph nodes, enlarged porta  hepatis and prominent retroperitoneal lymph nodes 8 mm subpleural nodule right middle lobe 7 mm nodule right lower lobe, 8 mm left lower lobe subpleural   CT abdomen/pelvis 05/2022 mesenteric lymphadenopathy, 5 mm right middle and lower lobe nodules   CT Cors 04/2021 right lower lobe 4 mm subpleural and right middle lobe 4 mm nodule versus lymph node  Review of Systems neg for any significant sore throat, dysphagia, itching, sneezing, nasal congestion or excess/ purulent secretions, fever, chills, sweats, unintended wt loss, pleuritic or exertional cp, hempoptysis, orthopnea pnd or change in chronic leg swelling. Also denies presyncope, palpitations, heartburn, abdominal pain, nausea, vomiting, diarrhea or change in bowel or urinary habits, dysuria,hematuria, rash, arthralgias, visual complaints, headache, numbness weakness or ataxia.     Objective:   Physical Exam  Gen. Pleasant, obese, in no distress ENT - no lesions, no post nasal drip Neck: No JVD, no thyromegaly, no carotid bruits Lungs: no use of accessory muscles, no dullness to percussion, decreased without rales or rhonchi  Cardiovascular: Rhythm regular, heart sounds  normal, no murmurs or gallops, no peripheral edema Musculoskeletal: No deformities, no cyanosis or clubbing , no tremors       Assessment & Plan:   Sarcoidosis Sarcoidosis with pulmonary, lymphatic, and ocular involvement, and hypercalcemia. Present for two years with no lung function impairment despite pulmonary nodules. Current treatment with Humira aims to maintain remission, protect ocular health, and manage hypercalcemia. Prognosis is uncertain with a 33% chance of remission, stability, or worsening. Humira is preferred over prednisone due to fewer long-term side effects such as weight gain and increased appetite. He tolerates Humira well with minor initial cramping and no  significant side effects. Ocular involvement is managed with steroid pellets. - Continue  Humira as maintenance therapy. - Monitor ocular health with follow-up appointments and CT angiogram in three months. - Check calcium levels during routine blood work- 9.2 09/2023 - Plan a CT scan of the lungs in six months to monitor nodules. - Avoid unnecessary abdominal scans unless symptomatic. - Educate on potential side effects of Humira, including increased infection risk.  Gastroesophageal reflux disease (GERD) GERD managed with Prilosec. No new symptoms or concerns.  Kidney cyst Small benign renal cyst identified on imaging, asymptomatic and not concerning.

## 2023-11-03 NOTE — Patient Instructions (Signed)
 X Ct chest wo con in 6 months

## 2023-11-03 NOTE — Progress Notes (Signed)
 Full PFT Performed Today

## 2023-11-03 NOTE — Patient Instructions (Signed)
 Full PFT Performed Today

## 2023-11-13 ENCOUNTER — Encounter: Payer: Self-pay | Admitting: Rheumatology

## 2023-11-13 ENCOUNTER — Ambulatory Visit: Attending: Rheumatology | Admitting: Rheumatology

## 2023-11-13 VITALS — BP 119/78 | HR 72 | Resp 15 | Ht 67.0 in | Wt 216.0 lb

## 2023-11-13 DIAGNOSIS — D869 Sarcoidosis, unspecified: Secondary | ICD-10-CM

## 2023-11-13 DIAGNOSIS — Z981 Arthrodesis status: Secondary | ICD-10-CM

## 2023-11-13 DIAGNOSIS — H209 Unspecified iridocyclitis: Secondary | ICD-10-CM

## 2023-11-13 DIAGNOSIS — F319 Bipolar disorder, unspecified: Secondary | ICD-10-CM

## 2023-11-13 DIAGNOSIS — F4310 Post-traumatic stress disorder, unspecified: Secondary | ICD-10-CM

## 2023-11-13 DIAGNOSIS — Z7952 Long term (current) use of systemic steroids: Secondary | ICD-10-CM

## 2023-11-13 DIAGNOSIS — I6529 Occlusion and stenosis of unspecified carotid artery: Secondary | ICD-10-CM

## 2023-11-13 DIAGNOSIS — Z79899 Other long term (current) drug therapy: Secondary | ICD-10-CM

## 2023-11-13 DIAGNOSIS — R778 Other specified abnormalities of plasma proteins: Secondary | ICD-10-CM

## 2023-11-13 DIAGNOSIS — G4733 Obstructive sleep apnea (adult) (pediatric): Secondary | ICD-10-CM

## 2023-11-13 DIAGNOSIS — N1832 Chronic kidney disease, stage 3b: Secondary | ICD-10-CM

## 2023-11-13 NOTE — Patient Instructions (Signed)
 Standing Labs We placed an order today for your standing lab work.   Please have your standing labs drawn in June and every 3 months  Please have your labs drawn 2 weeks prior to your appointment so that the provider can discuss your lab results at your appointment, if possible.  Please note that you may see your imaging and lab results in MyChart before we have reviewed them. We will contact you once all results are reviewed. Please allow our office up to 72 hours to thoroughly review all of the results before contacting the office for clarification of your results.  WALK-IN LAB HOURS  Monday through Thursday from 8:00 am -12:30 pm and 1:00 pm-5:00 pm and Friday from 8:00 am-12:00 pm.  Patients with office visits requiring labs will be seen before walk-in labs.  You may encounter longer than normal wait times. Please allow additional time. Wait times may be shorter on  Monday and Thursday afternoons.  We do not book appointments for walk-in labs. We appreciate your patience and understanding with our staff.   Labs are drawn by [facility]. Please bring your co-pay at the time of your lab draw.  You may receive a bill from [facility] for your lab work.  Please note if you are on Hydroxychloroquine and and an order has been placed for a Hydroxychloroquine level,  you will need to have it drawn 4 hours or more after your last dose.  If you wish to have your labs drawn at another location, please call the office 24 hours in advance so we can fax the orders.  The office is located at [address], [address], [place], [place] [address]   If you have any questions regarding directions or hours of operation,  please call [phone].   As a reminder, please drink plenty of water prior to coming for your lab work. Thanks!   Vaccines You are taking a medication(s) that can suppress your immune system.  The following immunizations are recommended: Flu annually Covid-19  RSV Td/Tdap (tetanus,  diphtheria, pertussis) every 10 years Pneumonia (Prevnar 15 then Pneumovax 23 at least 1 year apart.  Alternatively, can take Prevnar 20 without needing additional dose) Shingrix: 2 doses from 4 weeks to 6 months apart  Please check with your PCP to make sure you are up to date.   If you have signs or symptoms of an infection or start antibiotics: First, call your PCP for workup of your infection. Hold your medication through the infection, until you complete your antibiotics, and until symptoms resolve if you take the following: Injectable medication (Actemra, Benlysta, Cimzia, Cosentyx, Enbrel, Humira, Kevzara, Orencia, Remicade, Simponi, Stelara, Taltz, Tremfya) Methotrexate Leflunomide (Arava) Mycophenolate (Cellcept) Harriette Ohara, Olumiant, or Rinvoq   Please get an annual skin examination to screen for skin cancer.  Please use sunscreen and sun protection.

## 2023-11-14 LAB — COMPREHENSIVE METABOLIC PANEL
AG Ratio: 1.7 (calc) (ref 1.0–2.5)
ALT: 22 U/L (ref 9–46)
AST: 19 U/L (ref 10–35)
Albumin: 4.4 g/dL (ref 3.6–5.1)
Alkaline phosphatase (APISO): 72 U/L (ref 35–144)
BUN/Creatinine Ratio: 13 (calc) (ref 6–22)
BUN: 19 mg/dL (ref 7–25)
CO2: 26 mmol/L (ref 20–32)
Calcium: 9.8 mg/dL (ref 8.6–10.3)
Chloride: 105 mmol/L (ref 98–110)
Creat: 1.42 mg/dL — ABNORMAL HIGH (ref 0.70–1.30)
Globulin: 2.6 g/dL (ref 1.9–3.7)
Glucose, Bld: 88 mg/dL (ref 65–99)
Potassium: 4.9 mmol/L (ref 3.5–5.3)
Sodium: 140 mmol/L (ref 135–146)
Total Bilirubin: 0.5 mg/dL (ref 0.2–1.2)
Total Protein: 7 g/dL (ref 6.1–8.1)
eGFR: 58 mL/min/{1.73_m2} — ABNORMAL LOW (ref >=60)

## 2023-11-14 LAB — CBC WITH DIFFERENTIAL/PLATELET
Absolute Lymphocytes: 1201 {cells}/uL (ref 850–3900)
Absolute Monocytes: 691 {cells}/uL (ref 200–950)
Basophils Absolute: 39 {cells}/uL (ref 0–200)
Basophils Relative: 0.8 %
Eosinophils Absolute: 270 {cells}/uL (ref 15–500)
Eosinophils Relative: 5.5 %
HCT: 44.9 % (ref 38.5–50.0)
Hemoglobin: 15.4 g/dL (ref 13.2–17.1)
MCH: 31.7 pg (ref 27.0–33.0)
MCHC: 34.3 g/dL (ref 32.0–36.0)
MCV: 92.4 fL (ref 80.0–100.0)
MPV: 10.9 fL (ref 7.5–12.5)
Monocytes Relative: 14.1 %
Neutro Abs: 2700 {cells}/uL (ref 1500–7800)
Neutrophils Relative %: 55.1 %
Platelets: 218 10*3/uL (ref 140–400)
RBC: 4.86 10*6/uL (ref 4.20–5.80)
RDW: 13.2 % (ref 11.0–15.0)
Total Lymphocyte: 24.5 %
WBC: 4.9 10*3/uL (ref 3.8–10.8)

## 2023-11-15 NOTE — Progress Notes (Signed)
 CBC normal, creatinine is elevated and stable.  Please forward results to his PCP and Dr. Kathrene Bongo.

## 2023-12-14 ENCOUNTER — Other Ambulatory Visit: Payer: Self-pay | Admitting: Rheumatology

## 2023-12-14 DIAGNOSIS — Z79899 Other long term (current) drug therapy: Secondary | ICD-10-CM

## 2023-12-14 DIAGNOSIS — H209 Unspecified iridocyclitis: Secondary | ICD-10-CM

## 2023-12-14 DIAGNOSIS — D869 Sarcoidosis, unspecified: Secondary | ICD-10-CM

## 2023-12-15 NOTE — Telephone Encounter (Signed)
 Last Fill: 10/13/2023  Labs: 11/13/2023 CBC normal, creatinine is elevated and stable.   TB Gold: 09/04/2023 Neg    Next Visit: 03/15/2024  Last Visit: 11/13/2023  [id number]   Current Dose per office note 11/13/2023: Humira  40 mg subcu every other week   Okay to refill Hyrimoz ?

## 2024-01-21 NOTE — Progress Notes (Signed)
 Chief Complaint  Patient presents with  . Chronic Kidney Disease   Euthymic with good energy. Here for follow up sarcoid and CKD 3a. Low positive Kappa chains urine in February. SPE ordered last year but not completed.   Humira . Follows with Rheumatology.   Casey Yoder presents to primary care clinic for ongoing management of CKD .  labs: 10/01/2023 \ kls, cma (aama), aas  Last CPE:  04/02/2023  Patient is accompanied by: Mendy, spouse.  Verbal permission granted by patient to disclose health information.   SUBJECTIVE   Stage 3a chronic kidney disease  Patient presents for CKD follow up.   Currently medications reviewed.   Doing well w/o side effects.   Current symtpoms:  Patient states that overall he feels good. Pt is staying well hydrated. Is avoiding NSAIDS. To do same day labs (not discuss any labs).    Depression Screening    Little interest or pleasure in doing things: More than half the days  Feeling down, depressed, or hopeless: More than half the days  Trouble falling or staying asleep, or sleeping too much: Not at all Feeling tired or having little energy: More than half the days Poor appetite or overeating: Several days Feeling bad about yourself - or that you are a failure or have let yourself or your family down: More than half the days  Trouble concentrating on things, such as reading the newspaper or watching television: More than half the days  Moving or speaking so slowly that other people could have noticed? Or the opposite - being so fidgety or restless that you have been moving around a lot more than usual.: Not at all  Thoughts that you would be better off dead or hurting yourself in some way: More than half the days  Patient Health Questionnaire-2 Score: 4   I agree with the finding and determinations of the screening.   PHQ/GAD Scores       10/01/2023 0806 01/21/2024 1602       Patient Health Questionnaire-9 Score: 13 13             Allergies  Allergen Reactions  . Nsaids (Non-Steroidal Anti-Inflammatory Drug) Other (See Comments)    CKD after AKI unknown cause     Current Outpatient Medications:  .  buPROPion  (WELLBUTRIN  XL) 150 mg 24 hr tablet, Take 150 mg by mouth Once Daily., Disp: , Rfl:  .  cyanocobalamin (VITAMIN B12) 500 mcg tablet, Take 1 tablet by mouth Once Daily., Disp: , Rfl:  .  Hyrimoz ,CF, Pen 40 mg/0.4 mL pnij injection, INJECT 1 PEN UNDER THE SKIN EVERY 14 DAYS, Disp: , Rfl:  .  lamoTRIgine  (LaMICtal ) 25 mg tablet, Take 25 mg by mouth., Disp: , Rfl:  .  lurasidone  (Latuda ) tablet, 20 mg once., Disp: , Rfl:  .  magnesium chloride (MAG DELAY) 70 mg TbEC, Take 1 tablet by mouth nightly., Disp: , Rfl:  .  modafiniL (PROVIGIL) 200 mg tablet, Take 200 mg by mouth., Disp: , Rfl:   The following portions of the patient's history were reviewed and updated as appropriate: allergies, current medications, past medical history, past social history, past family history, and problem list.   ROS   CONSTITUTIONAL: Appetite good, no fevers, night sweats, or weight loss CV: No chest pain, shortness of breath, or peripheral edema RESPIRATORY: No wheezing or dyspnea GI: No nausea/vomiting, abdominal pain, or change in bowel habits GU: No dysuria, urgency or incontinence NEURO: No MS changes, no motor weakness, no  sensory changes  OBJECTIVE   BP 115/77 (BP Location: Left arm, Patient Position: Sitting)   Pulse 66   Temp 97.8 F (36.6 C) (Oral)   Resp 16   Ht 1.734 m (5' 8.25)   Wt 99.4 kg (219 lb 3.2 oz)   SpO2 95%   BMI 33.09 kg/m   GENERAL APPEARANCE: Well appearing, well developed, male no acute distress. HEENT: Conjunctiva without erythema or drainage. External auditory canals are non-swollen without exudate present. Tympanic membranes are normal. Nasal turbinates are without edema or drainage. Oropharynx is without lesion or exudate. NECK: Supple without lymphadenopathy or  thyromegaly LUNGS: Clear to auscultation bilaterally  HEART: Regular rate and rhythm. Normal 1st and 2nd heart sounds without murmur, gallops, or rubs. EXTREMITIES: No edema NEUROLOGIC: Alert and oriented x3, no obvious deficits.  SKIN:  No rashes or abnormal lesions visible.  PSY: euthymic with normal affect.  Physical Exam Psychiatric:        Attention and Perception: Attention and perception normal.        Mood and Affect: Mood and affect normal.        Speech: Speech normal.        Behavior: Behavior normal. Behavior is cooperative.        Thought Content: Thought content normal.        Cognition and Memory: Cognition and memory normal.        Judgment: Judgment normal.      Examination chaperoned by Lynwood Sullivan Barge, MD.   Results for orders placed or performed in visit on 10/01/23  Comprehensive Metabolic Panel   Collection Time: 10/01/23  8:52 AM  Result Value Ref Range   Sodium 138 136 - 145 mmol/L   Potassium 4.3 3.5 - 5.1 mmol/L   Chloride 105 98 - 107 mmol/L   CO2 24 21 - 31 mmol/L   Anion Gap 9 6 - 14 mmol/L   Glucose, Random 105 (H) 70 - 99 mg/dL   Blood Urea Nitrogen (BUN) 16 7 - 25 mg/dL   Creatinine 8.64 (H) 9.29 - 1.30 mg/dL   eGFR 61 >40 fO/fpw/8.26f7   Albumin 4.2 3.5 - 5.7 g/dL   Total Protein 6.7 6.4 - 8.9 g/dL   Bilirubin, Total 0.4 0.3 - 1.0 mg/dL   Alkaline Phosphatase (ALP) 88 34 - 104 U/L   Aspartate Aminotransferase (AST) 15 13 - 39 U/L   Alanine Aminotransferase (ALT) 21 7 - 52 U/L   Calcium 9.2 8.6 - 10.3 mg/dL   BUN/Creatinine Ratio 11.9 10.0 - 20.0  Free Kappa And Lambda Light Chains with Ratio, Quantitative   Collection Time: 10/01/23  8:52 AM  Result Value Ref Range   Free Kappa Light Chains, Serum 23.29 (H) 3.30 - 19.40 mg/L   Free Lambda Light Chains, Serum 20.59 5.71 - 26.30 mg/L   Kappa/Lambda Ratio, Serum 1.13 0.26 - 1.65      ASSESSMENT/PLAN   1. Stage 3a chronic kidney disease (CMD) (Primary) Counselled regarding  condition(s) and questions answered.  To stay well hydrated and drink 64 oz or more daily of water. Correct use and potential side effects of  medication(s) discussed. Reviewed worrisome signs and symptoms to watch for and patient instructed to call or return to office for any concerns. Labs ordered and will be called with results.   - Comprehensive Metabolic Panel; Future - Protein Electrophoresis, Serum; Future - Free Kappa And Lambda Light Chains with Ratio, Quantitative; Future  2. Sarcoidosis Labs ordered and will be called with  results.  - Free Kappa And Lambda Light Chains with Ratio, Quantitative; Future  3. Mesenteric panniculitis (HCC) Labs ordered and will be called with results.  - Free Kappa And Lambda Light Chains with Ratio, Quantitative; Future   1. Stage 3a chronic kidney disease (CMD)  Comprehensive Metabolic Panel   Protein Electrophoresis, Serum   Free Kappa And Lambda Light Chains with Ratio, Quantitative   Comprehensive Metabolic Panel   Protein Electrophoresis, Serum   Free Kappa And Lambda Light Chains with Ratio, Quantitative    2. Sarcoidosis  Free Kappa And Lambda Light Chains with Ratio, Quantitative   Free Kappa And Lambda Light Chains with Ratio, Quantitative    3. Mesenteric panniculitis (HCC)  Free Kappa And Lambda Light Chains with Ratio, Quantitative   Free Kappa And Lambda Light Chains with Ratio, Quantitative    4. Bipolar I disorder (HCC)       Continue present mood meds. Labs as ordered. Call results. Specialty follow ups as scheduled. Eye appointment AM.  Return for Currently Scheduled office visit appt.  This document serves as a record of services personally performed by Lynwood Barge, MD.  It was created on their behalf by Burnard Albino Rands, CMA, a trained medical scribe, and Certified Medical Assistant (CMA). During the course of documenting the history, physical exam and medical decision making, I was functioning as a Cytogeneticist. The creation of this record is the provider's dictation and/or activities during the visit.  Electronically signed by Burnard Albino Rands, CMA 01/21/2024 2:28 PM  This document serves as a record of services personally performed by Lynwood Barge, MD.  It was created on their behalf by Madelin Kraft, CMA, a trained medical scribe, and Certified Medical Assistant (CMA). During the course of documenting the history, physical exam and medical decision making, I was functioning as a Stage manager. The creation of this record is the provider's dictation and/or activities during the visit.  Electronically signed by Madelin Kraft, CMA, 01/21/2024 3:53 PM

## 2024-02-18 NOTE — Progress Notes (Deleted)
 Office Visit Note  Patient: Casey Yoder             Date of Birth: Jan 29, 1966           MRN: [id number]             PCP: Patrcia Lynwood RAMAN, MD Referring: Patrcia Lynwood RAMAN, MD Visit Date: 03/03/2024 Occupation: @GUAROCC @  Subjective:  No chief complaint on file.   History of Present Illness: Casey Yoder is a 58 y.o. male ***     Activities of Daily Living:  Patient reports morning stiffness for *** {minute/hour:19697}.   Patient {ACTIONS;DENIES/REPORTS:21021675::Denies} nocturnal pain.  Difficulty dressing/grooming: {ACTIONS;DENIES/REPORTS:21021675::Denies} Difficulty climbing stairs: {ACTIONS;DENIES/REPORTS:21021675::Denies} Difficulty getting out of chair: {ACTIONS;DENIES/REPORTS:21021675::Denies} Difficulty using hands for taps, buttons, cutlery, and/or writing: {ACTIONS;DENIES/REPORTS:21021675::Denies}  No Rheumatology ROS completed.   PMFS History:  Patient Active Problem List   Diagnosis Date Noted   Uveitis 10/13/2023   Sarcoidosis 09/28/2023   Constipation 05/18/2022   BPH (benign prostatic hyperplasia) 05/18/2022   Anemia 05/17/2022   Hypercalcemia 05/16/2022   AKI (acute kidney injury) (HCC) 05/16/2022   Bipolar 1 disorder (HCC)    OBSTRUCTIVE SLEEP APNEA 10/26/2009    Past Medical History:  Diagnosis Date   Bipolar 1 disorder (HCC)    Chest pain    Colon polyps    Complication of anesthesia    hard to wakeup, had to stay overnight due to sedation   Depression    Obesity    OBSTRUCTIVE SLEEP APNEA 10/26/2009   Qualifier: Diagnosis of  By: Corrie MD, Francis HERO    PTSD (post-traumatic stress disorder)    Renal failure     Family History  Problem Relation Age of Onset   Hypertension Mother    Glaucoma Father    Healthy Sister    Healthy Brother    Heart disease Maternal Grandfather 5   Healthy Daughter    Past Surgical History:  Procedure Laterality Date   KNEE SURGERY Left 08/25/1984   WRIST SURGERY     x2 on left wrist and  x1 on right wrist   Social History   Social History Narrative   Not on file    There is no immunization history on file for this patient.   Objective: Vital Signs: There were no vitals taken for this visit.   Physical Exam   Musculoskeletal Exam: ***  CDAI Exam: CDAI Score: -- Patient Global: --; Provider Global: -- Swollen: --; Tender: -- Joint Exam 03/03/2024   No joint exam has been documented for this visit   There is currently no information documented on the homunculus. Go to the Rheumatology activity and complete the homunculus joint exam.  Investigation: No additional findings.  Imaging: No results found.  Recent Labs: Lab Results  Component Value Date   WBC 4.9 11/13/2023   HGB 15.4 11/13/2023   PLT 218 11/13/2023   NA 140 11/13/2023   K 4.9 11/13/2023   CL 105 11/13/2023   CO2 26 11/13/2023   GLUCOSE 88 11/13/2023   BUN 19 11/13/2023   CREATININE 1.42 (H) 11/13/2023   BILITOT 0.5 11/13/2023   ALKPHOS 121 05/26/2022   AST 19 11/13/2023   ALT 22 11/13/2023   PROT 7.0 11/13/2023   ALBUMIN 4.4 05/26/2022   CALCIUM 9.8 11/13/2023   GFRAA >60 02/13/2017    Speciality Comments: Adalimumab  started 10/14/23 (adalimumab -adaz per insurance)  Procedures:  No procedures performed Allergies: Patient has no known allergies.   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  No orders of the defined types were placed in this encounter.   Face-to-face time spent with patient was *** minutes. Greater than 50% of time was spent in counseling and coordination of  care.  Follow-Up Instructions: No follow-ups on file.   Waddell CHRISTELLA Craze, PA-C  Note - This record has been created using Dragon software.  Chart creation errors have been sought, but may not always  have been located. Such creation errors do not reflect on  the standard of medical care.

## 2024-02-29 NOTE — Progress Notes (Unsigned)
 Office Visit Note  Patient: Casey Yoder             Date of Birth: 1966/08/01           MRN: [id number]             PCP: Patrcia Lynwood RAMAN, MD Referring: Patrcia Lynwood RAMAN, MD Visit Date: 03/01/2024 Occupation: @GUAROCC @  Subjective:  No chief complaint on file.   History of Present Illness: Casey Yoder is a 58 y.o. male ***     Activities of Daily Living:  Patient reports morning stiffness for *** {minute/hour:19697}.   Patient {ACTIONS;DENIES/REPORTS:21021675::Denies} nocturnal pain.  Difficulty dressing/grooming: {ACTIONS;DENIES/REPORTS:21021675::Denies} Difficulty climbing stairs: {ACTIONS;DENIES/REPORTS:21021675::Denies} Difficulty getting out of chair: {ACTIONS;DENIES/REPORTS:21021675::Denies} Difficulty using hands for taps, buttons, cutlery, and/or writing: {ACTIONS;DENIES/REPORTS:21021675::Denies}  No Rheumatology ROS completed.   PMFS History:  Patient Active Problem List   Diagnosis Date Noted   Uveitis 10/13/2023   Sarcoidosis 09/28/2023   Constipation 05/18/2022   BPH (benign prostatic hyperplasia) 05/18/2022   Anemia 05/17/2022   Hypercalcemia 05/16/2022   AKI (acute kidney injury) (HCC) 05/16/2022   Bipolar 1 disorder (HCC)    OBSTRUCTIVE SLEEP APNEA 10/26/2009    Past Medical History:  Diagnosis Date   Bipolar 1 disorder (HCC)    Chest pain    Colon polyps    Complication of anesthesia    hard to wakeup, had to stay overnight due to sedation   Depression    Obesity    OBSTRUCTIVE SLEEP APNEA 10/26/2009   Qualifier: Diagnosis of  By: Corrie MD, Francis HERO    PTSD (post-traumatic stress disorder)    Renal failure     Family History  Problem Relation Age of Onset   Hypertension Mother    Glaucoma Father    Healthy Sister    Healthy Brother    Heart disease Maternal Grandfather 91   Healthy Daughter    Past Surgical History:  Procedure Laterality Date   KNEE SURGERY Left 08/25/1984   WRIST SURGERY     x2 on left wrist and  x1 on right wrist   Social History   Social History Narrative   Not on file    There is no immunization history on file for this patient.   Objective: Vital Signs: There were no vitals taken for this visit.   Physical Exam   Musculoskeletal Exam: ***  CDAI Exam: CDAI Score: -- Patient Global: --; Provider Global: -- Swollen: --; Tender: -- Joint Exam 03/01/2024   No joint exam has been documented for this visit   There is currently no information documented on the homunculus. Go to the Rheumatology activity and complete the homunculus joint exam.  Investigation: No additional findings.  Imaging: No results found.  Recent Labs: Lab Results  Component Value Date   WBC 4.9 11/13/2023   HGB 15.4 11/13/2023   PLT 218 11/13/2023   NA 140 11/13/2023   K 4.9 11/13/2023   CL 105 11/13/2023   CO2 26 11/13/2023   GLUCOSE 88 11/13/2023   BUN 19 11/13/2023   CREATININE 1.42 (H) 11/13/2023   BILITOT 0.5 11/13/2023   ALKPHOS 121 05/26/2022   AST 19 11/13/2023   ALT 22 11/13/2023   PROT 7.0 11/13/2023   ALBUMIN 4.4 05/26/2022   CALCIUM 9.8 11/13/2023   GFRAA >60 02/13/2017    Speciality Comments: Adalimumab  started 10/14/23 (adalimumab -adaz per insurance)  Procedures:  No procedures performed Allergies: Patient has no known allergies.   Assessment / Plan:     Visit  Diagnoses: No diagnosis found.  Orders: No orders of the defined types were placed in this encounter.  No orders of the defined types were placed in this encounter.   Face-to-face time spent with patient was *** minutes. Greater than 50% of time was spent in counseling and coordination of care.  Follow-Up Instructions: No follow-ups on file.   Maya Nash, MD  Note - This record has been created using Animal nutritionist.  Chart creation errors have been sought, but may not always  have been located. Such creation errors do not reflect on  the standard of medical care.

## 2024-03-01 ENCOUNTER — Other Ambulatory Visit: Payer: Self-pay | Admitting: Physician Assistant

## 2024-03-01 ENCOUNTER — Encounter: Payer: Self-pay | Admitting: Rheumatology

## 2024-03-01 ENCOUNTER — Ambulatory Visit: Attending: Rheumatology | Admitting: Rheumatology

## 2024-03-01 VITALS — BP 109/76 | HR 80 | Resp 14 | Ht 67.0 in | Wt 220.0 lb

## 2024-03-01 DIAGNOSIS — F4310 Post-traumatic stress disorder, unspecified: Secondary | ICD-10-CM

## 2024-03-01 DIAGNOSIS — G4733 Obstructive sleep apnea (adult) (pediatric): Secondary | ICD-10-CM

## 2024-03-01 DIAGNOSIS — H209 Unspecified iridocyclitis: Secondary | ICD-10-CM

## 2024-03-01 DIAGNOSIS — Z79899 Other long term (current) drug therapy: Secondary | ICD-10-CM

## 2024-03-01 DIAGNOSIS — Z981 Arthrodesis status: Secondary | ICD-10-CM

## 2024-03-01 DIAGNOSIS — D869 Sarcoidosis, unspecified: Secondary | ICD-10-CM

## 2024-03-01 DIAGNOSIS — F319 Bipolar disorder, unspecified: Secondary | ICD-10-CM

## 2024-03-01 DIAGNOSIS — I6529 Occlusion and stenosis of unspecified carotid artery: Secondary | ICD-10-CM

## 2024-03-01 DIAGNOSIS — R778 Other specified abnormalities of plasma proteins: Secondary | ICD-10-CM

## 2024-03-01 DIAGNOSIS — N1832 Chronic kidney disease, stage 3b: Secondary | ICD-10-CM

## 2024-03-01 LAB — COMPREHENSIVE METABOLIC PANEL WITH GFR
AG Ratio: 1.8 (calc) (ref 1.0–2.5)
ALT: 22 U/L (ref 9–46)
AST: 21 U/L (ref 10–35)
Albumin: 4.6 g/dL (ref 3.6–5.1)
Alkaline phosphatase (APISO): 70 U/L (ref 35–144)
BUN/Creatinine Ratio: 10 (calc) (ref 6–22)
BUN: 18 mg/dL (ref 7–25)
CO2: 29 mmol/L (ref 20–32)
Calcium: 10 mg/dL (ref 8.6–10.3)
Chloride: 103 mmol/L (ref 98–110)
Creat: 1.77 mg/dL — ABNORMAL HIGH (ref 0.70–1.30)
Globulin: 2.6 g/dL (ref 1.9–3.7)
Glucose, Bld: 116 mg/dL — ABNORMAL HIGH (ref 65–99)
Potassium: 4.3 mmol/L (ref 3.5–5.3)
Sodium: 139 mmol/L (ref 135–146)
Total Bilirubin: 0.6 mg/dL (ref 0.2–1.2)
Total Protein: 7.2 g/dL (ref 6.1–8.1)
eGFR: 44 mL/min/1.73m2 — ABNORMAL LOW (ref >=60)

## 2024-03-01 LAB — CBC WITH DIFFERENTIAL/PLATELET
Absolute Lymphocytes: 1628 {cells}/uL (ref 850–3900)
Absolute Monocytes: 673 {cells}/uL (ref 200–950)
Basophils Absolute: 18 {cells}/uL (ref 0–200)
Basophils Relative: 0.3 %
Eosinophils Absolute: 384 {cells}/uL (ref 15–500)
Eosinophils Relative: 6.5 %
HCT: 45.9 % (ref 38.5–50.0)
Hemoglobin: 15.3 g/dL (ref 13.2–17.1)
MCH: 31.4 pg (ref 27.0–33.0)
MCHC: 33.3 g/dL (ref 32.0–36.0)
MCV: 94.3 fL (ref 80.0–100.0)
MPV: 10.8 fL (ref 7.5–12.5)
Monocytes Relative: 11.4 %
Neutro Abs: 3198 {cells}/uL (ref 1500–7800)
Neutrophils Relative %: 54.2 %
Platelets: 211 Thousand/uL (ref 140–400)
RBC: 4.87 Million/uL (ref 4.20–5.80)
RDW: 12.1 % (ref 11.0–15.0)
Total Lymphocyte: 27.6 %
WBC: 5.9 Thousand/uL (ref 3.8–10.8)

## 2024-03-01 LAB — HEMOGLOBIN A1C
Hgb A1c MFr Bld: 5.9 % — ABNORMAL HIGH (ref <5.7)
Mean Plasma Glucose: 123 mg/dL
eAG (mmol/L): 6.8 mmol/L

## 2024-03-01 MED ORDER — ADALIMUMAB-ADAZ 40 MG/0.4ML ~~LOC~~ SOAJ: 40.0000 mg | SUBCUTANEOUS | 2 refills | Status: DC

## 2024-03-01 NOTE — Patient Instructions (Signed)
 Standing Labs We placed an order today for your standing lab work.   Please have your standing labs drawn in October and every 3 months  Please have your labs drawn 2 weeks prior to your appointment so that the provider can discuss your lab results at your appointment, if possible.  Please note that you may see your imaging and lab results in MyChart before we have reviewed them. We will contact you once all results are reviewed. Please allow our office up to 72 hours to thoroughly review all of the results before contacting the office for clarification of your results.  WALK-IN LAB HOURS  Monday through Thursday from 8:00 am -12:30 pm and 1:00 pm-4:30 pm and Friday from 8:00 am-12:00 pm.  Patients with office visits requiring labs will be seen before walk-in labs.  You may encounter longer than normal wait times. Please allow additional time. Wait times may be shorter on  Monday and Thursday afternoons.  We do not book appointments for walk-in labs. We appreciate your patience and understanding with our staff.   Labs are drawn by [facility]. Please bring your co-pay at the time of your lab draw.  You may receive a bill from [facility] for your lab work.  Please note if you are on Hydroxychloroquine and and an order has been placed for a Hydroxychloroquine level,  you will need to have it drawn 4 hours or more after your last dose.  If you wish to have your labs drawn at another location, please call the office 24 hours in advance so we can fax the orders.  The office is located at [address], [address], [place], [place] [address]   If you have any questions regarding directions or hours of operation,  please call [phone].   As a reminder, please drink plenty of water prior to coming for your lab work. Thanks!   Vaccines You are taking a medication(s) that can suppress your immune system.  The following immunizations are recommended: Flu annually Covid-19  Td/Tdap (tetanus,  diphtheria, pertussis) every 10 years Pneumonia (Prevnar 15 then Pneumovax 23 at least 1 year apart.  Alternatively, can take Prevnar 20 without needing additional dose) Shingrix: 2 doses from 4 weeks to 6 months apart  Please check with your PCP to make sure you are up to date.   If you have signs or symptoms of an infection or start antibiotics: First, call your PCP for workup of your infection. Hold your medication through the infection, until you complete your antibiotics, and until symptoms resolve if you take the following: Injectable medication (Actemra, Benlysta, Cimzia, Cosentyx, Enbrel, Humira , Kevzara, Orencia, Remicade, Simponi, Stelara, Taltz, Tremfya) Methotrexate Leflunomide (Arava) Mycophenolate (Cellcept) Earma, Olumiant, or Rinvoq   Please get annual skin examination to screen for skin cancer while you are on Humira .  Please use sunscreen and sun protection.

## 2024-03-02 ENCOUNTER — Ambulatory Visit: Payer: Self-pay | Admitting: Rheumatology

## 2024-03-02 NOTE — Progress Notes (Signed)
 Hemoglobin (elevated at 5.9, glucose is elevated at 116, creatinine elevated at 1.77, CBC normal.  Please notify patient and forward results to his PCP and Dr. Prescilla.

## 2024-03-03 ENCOUNTER — Encounter: Payer: No Typology Code available for payment source | Admitting: Rheumatology

## 2024-03-03 ENCOUNTER — Ambulatory Visit: Admitting: Rheumatology

## 2024-03-03 DIAGNOSIS — R778 Other specified abnormalities of plasma proteins: Secondary | ICD-10-CM

## 2024-03-03 DIAGNOSIS — I6529 Occlusion and stenosis of unspecified carotid artery: Secondary | ICD-10-CM

## 2024-03-03 DIAGNOSIS — N1832 Chronic kidney disease, stage 3b: Secondary | ICD-10-CM

## 2024-03-03 DIAGNOSIS — Z79899 Other long term (current) drug therapy: Secondary | ICD-10-CM

## 2024-03-03 DIAGNOSIS — G4733 Obstructive sleep apnea (adult) (pediatric): Secondary | ICD-10-CM

## 2024-03-03 DIAGNOSIS — H209 Unspecified iridocyclitis: Secondary | ICD-10-CM

## 2024-03-03 DIAGNOSIS — Z981 Arthrodesis status: Secondary | ICD-10-CM

## 2024-03-03 DIAGNOSIS — D869 Sarcoidosis, unspecified: Secondary | ICD-10-CM

## 2024-03-03 DIAGNOSIS — F319 Bipolar disorder, unspecified: Secondary | ICD-10-CM

## 2024-03-03 DIAGNOSIS — F4310 Post-traumatic stress disorder, unspecified: Secondary | ICD-10-CM

## 2024-03-15 ENCOUNTER — Ambulatory Visit: Admitting: Rheumatology

## 2024-03-31 ENCOUNTER — Ambulatory Visit: Payer: No Typology Code available for payment source | Admitting: Rheumatology

## 2024-05-06 ENCOUNTER — Ambulatory Visit (HOSPITAL_BASED_OUTPATIENT_CLINIC_OR_DEPARTMENT_OTHER): Admission: RE | Admit: 2024-05-06 | Source: Ambulatory Visit

## 2024-05-12 ENCOUNTER — Encounter (HOSPITAL_BASED_OUTPATIENT_CLINIC_OR_DEPARTMENT_OTHER): Payer: Self-pay | Admitting: Pulmonary Disease

## 2024-05-12 NOTE — Telephone Encounter (Signed)
**Note De-identified  [place] Obfuscation** Please advise 

## 2024-05-12 NOTE — Telephone Encounter (Signed)
Can you help please?

## 2024-05-20 ENCOUNTER — Ambulatory Visit (INDEPENDENT_AMBULATORY_CARE_PROVIDER_SITE_OTHER): Admitting: Pulmonary Disease

## 2024-05-20 ENCOUNTER — Encounter (HOSPITAL_BASED_OUTPATIENT_CLINIC_OR_DEPARTMENT_OTHER): Payer: Self-pay | Admitting: Pulmonary Disease

## 2024-05-20 VITALS — BP 111/97 | HR 72 | Ht 67.0 in | Wt 216.0 lb

## 2024-05-20 DIAGNOSIS — M7122 Synovial cyst of popliteal space [Baker], left knee: Secondary | ICD-10-CM | POA: Diagnosis not present

## 2024-05-20 DIAGNOSIS — G4733 Obstructive sleep apnea (adult) (pediatric): Secondary | ICD-10-CM | POA: Diagnosis not present

## 2024-05-20 DIAGNOSIS — R591 Generalized enlarged lymph nodes: Secondary | ICD-10-CM | POA: Diagnosis not present

## 2024-05-20 DIAGNOSIS — D869 Sarcoidosis, unspecified: Secondary | ICD-10-CM

## 2024-05-20 NOTE — Progress Notes (Signed)
 Subjective:    Patient ID: Casey Yoder, male    DOB: [birth year], 58 y.o.   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 This also explained his previous presentation with AKI and hypercalcemia. He has been started on 40 mg of prednisone      PMH :  admitted 04/2022 with hypercalcemia ranging from 10.6-11.3 and AKI with BUN/creatinine 38/3.7.  He was treated with bisphosphonate Cr decreased 08/05/2023 of 1.8.   He works as an Radio broadcast assistant, his spouse Graeme is a respiratory therapist   Discussed the use of AI scribe software for clinical note transcription with the patient, who gave verbal consent to proceed.  History of Present Illness Casey Yoder is a 58 year old male with sarcoidosis who presents with dizziness and vision changes. He is accompanied by [name], his partner.  Dizziness occurs primarily in the mornings upon waking and is described as a balance issue. It has persisted for a couple of weeks but has improved with regular omeprazole use. As the day progresses, dizziness subsides to brain fog and nausea. He experiences no daytime sleepiness and is compliant with CPAP therapy.  Vision changes have worsened, requiring more frequent use of reading glasses for activities such as watching TV and working. [facility] to assess these changes, with previous evaluations for eye bleeding related to sarcoidosis.  He is on Humira  for sarcoidosis, taken for three to four months. Recent blood work shows stable calcium levels but elevated kidney function markers. His medication regimen also includes Latuda  for PTSD, a quarter tablet of Provigil (25 mg) daily, and a multivitamin with B12. A hard knot on the back of his knee was noticed by his  partner.     Significant tests/ events reviewed   PFTs 10/2023 normal CT chest /A/P 09/2023 >> Substantial peribronchovascular lymph nodes bilaterally along subpleural nodules in a fairly classic distribution for pulmonary sarcoidosis, increased compared to 04/2021 09/04/2023 ACE level 113   CT abdomen/pelvis 08/2023 prominent mesenteric lymph nodes, enlarged porta hepatis and prominent retroperitoneal lymph nodes 8 mm subpleural nodule right middle lobe 7 mm nodule right lower lobe, 8 mm left lower lobe subpleural   CT abdomen/pelvis 05/2022 mesenteric lymphadenopathy, 5 mm right middle and lower lobe nodules   CT Cors 04/2021 right lower lobe 4 mm subpleural and right middle lobe 4 mm nodule versus lymph node  Review of Systems  neg for any significant sore throat, dysphagia, itching, sneezing, nasal congestion or excess/ purulent secretions, fever, chills, sweats, unintended wt loss, pleuritic or exertional cp, hempoptysis, orthopnea pnd or change in chronic leg swelling. Also denies presyncope, palpitations, heartburn, abdominal pain, nausea, vomiting, diarrhea or change in bowel or urinary habits, dysuria,hematuria, rash, arthralgias, visual complaints, headache, numbness weakness or ataxia.      Objective:   Physical Exam  Gen. Pleasant, obese, in no distress ENT - no lesions, no post nasal drip Neck: No JVD, no thyromegaly, no carotid bruits Lungs: no use of accessory muscles, no dullness to percussion, decreased without rales or rhonchi  Cardiovascular: Rhythm regular, heart sounds  normal, no murmurs or gallops, no peripheral edema Musculoskeletal: No deformities, no cyanosis or clubbing , no tremors       Assessment & Plan:   Assessment and Plan Assessment & Plan Sarcoidosis with lymphadenopathy  Currently well-managed on Humira . Hypercalcemia has improved with normal calcium levels at 10. Chronic kidney disease persists with elevated creatinine at 1.7.  - Order CT chest,  abdomen, and pelvis in November to assess lymphadenopathy. - Continue Humira  treatment. - Monitor kidney function and calcium levels.  Obstructive sleep apnea Managed with CPAP. Compliance reported, but no recent evaluation of CPAP efficacy. - Review CPAP report to assess efficacy and ensure proper function.  Vision changes Under evaluation by ophthalmologist and retina specialist. Requires constant use of reading glasses.  - Follow up with ophthalmologist and retina specialist as scheduled.  Dizziness and nausea Previously experienced upon waking, now improved with regular use of omeprazole. Symptoms have not recurred in the past week. No specific pattern identified for dizziness. - Continue omeprazole as currently prescribed. - Monitor for recurrence of dizziness and nausea.  Left popliteal fossa mass (possible Baker's cyst) Hard mass noted in the left popliteal fossa, possibly a Baker's cyst. No immediate concern for malignancy. - Order left knee ultrasound to evaluate the popliteal fossa mass.

## 2024-05-20 NOTE — Patient Instructions (Addendum)
  X CPAP report  X CT chest/abdomen/pelvis in November  X venous duplex LT knee    VISIT SUMMARY: During your visit, we discussed your ongoing issues with dizziness, vision changes, and a new mass behind your knee. We reviewed your current medications and planned further evaluations to monitor your conditions.  YOUR PLAN: -SARCOIDOSIS WITH LYMPHADENOPATHY: Sarcoidosis is an inflammatory disease that affects multiple organs, particularly the lungs and lymph glands. Your condition is currently well-managed with Humira , and your calcium levels have improved. We will continue monitoring your kidney function and calcium levels, and a CT scan of your chest, abdomen, and pelvis is scheduled for November to assess your lymph nodes.  -OBSTRUCTIVE SLEEP APNEA: Obstructive sleep apnea is a condition where your breathing stops and starts during sleep. You are managing this with CPAP therapy, and we will review your CPAP report to ensure it is working effectively.  -VISION CHANGES: You have been experiencing worsening vision changes, requiring more frequent use of reading glasses. You have a follow-up appointment with an eye doctor in November to further evaluate these changes.  -DIZZINESS AND NAUSEA: You have been experiencing dizziness and nausea, particularly in the mornings, which has improved with the use of omeprazole. We will continue with the current medication and monitor for any recurrence of symptoms.  -LEFT POPLITEAL FOSSA MASS (POSSIBLE BAKER'S CYST): A Baker's cyst is a fluid-filled cyst behind the knee that can cause a lump and discomfort. We will order an ultrasound of your left knee to evaluate the mass further.  INSTRUCTIONS: Please continue taking your medications as prescribed. Follow up with your ophthalmologist and retina specialist as scheduled in November. We will also review your CPAP report to ensure it is functioning properly. An ultrasound of your left knee will be scheduled to  evaluate the mass. If you experience any recurrence of dizziness or nausea, please let us  know.                      Contains text generated by [employer]bridge.                                 Contains text generated by [employer]bridge.

## 2024-05-23 ENCOUNTER — Other Ambulatory Visit: Payer: Self-pay | Admitting: Rheumatology

## 2024-05-23 DIAGNOSIS — H209 Unspecified iridocyclitis: Secondary | ICD-10-CM

## 2024-05-23 DIAGNOSIS — D869 Sarcoidosis, unspecified: Secondary | ICD-10-CM

## 2024-05-24 ENCOUNTER — Ambulatory Visit: Payer: Self-pay | Admitting: Pulmonary Disease

## 2024-05-24 ENCOUNTER — Ambulatory Visit (HOSPITAL_COMMUNITY)
Admission: RE | Admit: 2024-05-24 | Discharge: 2024-05-24 | Disposition: A | Discharge status: Home / Self Care | Source: Ambulatory Visit | Attending: Pulmonary Disease | Admitting: Pulmonary Disease

## 2024-05-24 DIAGNOSIS — M7122 Synovial cyst of popliteal space [Baker], left knee: Secondary | ICD-10-CM | POA: Insufficient documentation

## 2024-05-24 NOTE — Telephone Encounter (Signed)
 Last Fill: 03/01/2024  Labs: 03/01/2024 Hemoglobin (elevated at 5.9, glucose is elevated at 116, creatinine elevated at 1.77, CBC normal.   TB Gold: 09/04/2023 negative    Next Visit: 06/09/2024  Last Visit: 03/01/2024  IK:[id number], Uveitis   Current Dose per office note on 03/01/2024: Humira  40 mg subcu every other week,   Okay to refill Hyrimoz ?

## 2024-05-26 NOTE — Progress Notes (Signed)
 Office Visit Note  Patient: Casey Yoder             Date of Birth: 1966-04-03           MRN: [id number]             PCP: Patrcia Lynwood RAMAN, MD Referring: Patrcia Lynwood RAMAN, MD Visit Date: 06/09/2024 Occupation: Data Unavailable  Subjective:  Medication management  History of Present Illness: Casey Yoder is a 58 y.o. male with sarcoidosis and uveitis.  He returns today after his last visit in March 2025.  He states he has been taking adalimumab  every other week without any interruption until recently.  He had a dog bite on his right index finger he is currently on antibiotics and holding Humira  for the last 1 week.  Patient states that he was bitten by his own dog and his finger became very red and inflamed.  After starting Augmentin the redness and swelling started improving.  He had an ophthalmology appointment with the retina specialist last month which went well.  [facility]  He was evaluated by Dr. Jude on May 20, 2024.  He had a CT scan on June 03, 2024 which showed multiple scattered bilateral pulmonary nodules throughout the lungs.  Somewhat unchanged from the previous study per radiology report.  Perilymphatic nodularity about the hila was markedly improved per report.    Activities of Daily Living:  Patient reports morning stiffness for 0  minute.   Patient Denies nocturnal pain.  Difficulty dressing/grooming: Denies Difficulty climbing stairs: Denies Difficulty getting out of chair: Denies Difficulty using hands for taps, buttons, cutlery, and/or writing: Denies  Review of Systems  Constitutional:  Negative for fatigue.  HENT:  Negative for mouth sores and mouth dryness.   Eyes:  Negative for dryness.  Respiratory:  Negative for shortness of breath.   Cardiovascular:  Negative for chest pain and palpitations.  Gastrointestinal:  Negative for blood in stool, constipation and diarrhea.  Endocrine:  Negative for increased urination.  Genitourinary:  Negative for hematuria.  Musculoskeletal:  Negative for joint pain, gait problem, joint pain, joint swelling, myalgias, muscle weakness and myalgias.  Skin:  Negative for color change, rash and sensitivity to sunlight.  Allergic/Immunologic: Negative for susceptible to infections.  Neurological:  Negative for headaches.  Hematological:  Negative for swollen glands.  Psychiatric/Behavioral:  Negative for depressed mood and sleep disturbance. The patient is not nervous/anxious.     PMFS History:  Patient Active Problem List   Diagnosis Date Noted   Stage 3b chronic kidney disease (HCC) 06/09/2024   Status post fusion of wrist-bilateral by Dr. Camella 06/09/2024   Stenosis of carotid artery 06/09/2024   PTSD (post-traumatic stress disorder) 06/09/2024   Uveitis 10/13/2023   Sarcoidosis 09/28/2023   Constipation 05/18/2022   BPH (benign prostatic hyperplasia) 05/18/2022   Anemia 05/17/2022   Hypercalcemia 05/16/2022   AKI (acute kidney injury) 05/16/2022   Bipolar 1 disorder (HCC)    OBSTRUCTIVE SLEEP APNEA 10/26/2009    Past Medical History:  Diagnosis Date   Bipolar 1 disorder (HCC)    Chest pain    Colon polyps    Complication of anesthesia    hard to wakeup, had to stay overnight due to sedation   Depression    Obesity    OBSTRUCTIVE SLEEP APNEA 10/26/2009   Qualifier: Diagnosis of  By: Corrie MD, Francis HERO    PTSD (post-traumatic stress disorder)  Renal failure     Family History  Problem Relation Age of Onset   Hypertension Mother    Glaucoma Father    Healthy Sister    Healthy Brother    Heart disease Maternal Grandfather 67   Healthy Daughter    Past Surgical History:  Procedure Laterality Date   KNEE SURGERY Left 08/25/1984   WRIST SURGERY     x2 on left wrist and x1 on right wrist   Social History   Tobacco Use   Smoking status: Never    Passive exposure: Past   Smokeless tobacco: Never  Vaping Use    Vaping status: Never Used  Substance Use Topics   Alcohol use: No   Drug use: No   Social History   Social History Narrative   Not on file      There is no immunization history on file for this patient.   Objective: Vital Signs: BP 121/83   Pulse 65   Temp 98.4 F (36.9 C)   Resp 14   Ht 5' 7 (1.702 m)   Wt 224 lb 9.6 oz (101.9 kg)   BMI 35.18 kg/m    Physical Exam Vitals and nursing note reviewed.  Constitutional:      Appearance: He is well-developed.  HENT:     Head: Normocephalic and atraumatic.  Eyes:     Conjunctiva/sclera: Conjunctivae normal.     Pupils: Pupils are equal, round, and reactive to light.  Cardiovascular:     Rate and Rhythm: Normal rate and regular rhythm.     Heart sounds: Normal heart sounds.  Pulmonary:     Effort: Pulmonary effort is normal.     Breath sounds: Normal breath sounds.  Abdominal:     General: Bowel sounds are normal.     Palpations: Abdomen is soft.  Musculoskeletal:     Cervical back: Normal range of motion and neck supple.  Skin:    General: Skin is warm and dry.     Capillary Refill: Capillary refill takes less than 2 seconds.     Comments: A puncture wound was noted on his right index finger distal phalanx.  Neurological:     Mental Status: He is alert and oriented to person, place, and time.  Psychiatric:        Behavior: Behavior normal.      Musculoskeletal Exam: Cervical, thoracic and lumbar spine were in good range of motion.  There was no SI joint tenderness.  Shoulder joints, elbow joints, wrist joints, MCPs, PIPs and DIPs were in good range of motion with no synovitis.  Hip joints and knee joints were in good range of motion without any warmth swelling or effusion.  There was no tenderness over ankles or MTPs.   CDAI Exam: CDAI Score: -- Patient Global: --; Provider Global: -- Swollen: --; Tender: -- Joint Exam 06/09/2024   No joint exam has been documented for this visit   There is currently no  information documented on the homunculus. Go to the Rheumatology activity and complete the homunculus joint exam.  Investigation: No additional findings.  Imaging: CT CHEST ABDOMEN PELVIS WO CONTRAST Result Date: 06/06/2024 CLINICAL DATA:  Sarcoidosis, pulmonary nodules * Tracking Code: BO * EXAM: CT CHEST, ABDOMEN AND PELVIS WITHOUT CONTRAST TECHNIQUE: Multidetector CT imaging of the chest, abdomen and pelvis was performed following the standard protocol without IV contrast. Oral enteric contrast was administered. RADIATION DOSE REDUCTION: This exam was performed according to the departmental dose-optimization program which includes automated  exposure control, adjustment of the mA and/or kV according to patient size and/or use of iterative reconstruction technique. COMPARISON:  10/01/2023 FINDINGS: CT CHEST FINDINGS Cardiovascular: No significant vascular findings. Normal heart size. No pericardial effusion. Mediastinum/Nodes: No enlarged mediastinal, hilar, or axillary lymph nodes. Thyroid  gland, trachea, and esophagus demonstrate no significant findings. Lungs/Pleura: Diffuse bilateral bronchial wall thickening. Multiple scattered bilateral pulmonary nodules throughout the lungs. Some of these are unchanged compared to prior examination, for example a subpleural nodule in the anterior right middle lobe measuring 0.6 cm (series 6, image 103), many other previously seen nodules are diminished in size or completely resolved. Particularly, very extensive, clustered perilymphatic nodularity about the hila is markedly improved compared to prior examination. No pleural effusion or pneumothorax. Musculoskeletal: No chest wall abnormality. No acute osseous findings. CT ABDOMEN PELVIS FINDINGS Hepatobiliary: No solid liver abnormality is seen. No gallstones, gallbladder wall thickening, or biliary dilatation. Pancreas: Unremarkable. No pancreatic ductal dilatation or surrounding inflammatory changes. Spleen: Normal  in size without significant abnormality. Adrenals/Urinary Tract: Adrenal glands are unremarkable. Kidneys are normal, without renal calculi, solid lesion, or hydronephrosis. Bladder is unremarkable. Stomach/Bowel: Stomach is within normal limits. Appendix appears normal. No evidence of bowel wall thickening, distention, or inflammatory changes. Moderate burden of stool throughout the colon and rectum. Vascular/Lymphatic: No significant vascular findings are present. No enlarged abdominal or pelvic lymph nodes. Reproductive: No mass or other abnormality. Other: No abdominal wall hernia or abnormality. No ascites. Similar mild diffuse ground-glass stranding throughout the central small bowel mesentery. Musculoskeletal: No acute osseous findings. IMPRESSION: 1. Multiple scattered bilateral pulmonary nodules throughout the lungs. Some of these are unchanged compared to prior examination, many other previously seen nodules are diminished in size or completely resolved. Particularly, very extensive, clustered perilymphatic nodularity about the hila is markedly improved compared to prior examination. Findings are consistent with improved pulmonary sarcoidosis. 2. Diffuse bilateral bronchial wall thickening, consistent with nonspecific infectious or inflammatory bronchitis. 3. No imaging evidence of nodal sarcoidosis or other lymphadenopathy in the chest, abdomen, or pelvis. Electronically Signed   By: Marolyn JONETTA Jaksch M.D.   On: 06/06/2024 06:10   VAS US  LOWER EXTREMITY VENOUS (DVT) Result Date: 05/24/2024  Lower Venous DVT Study Patient Name:  Samuel Mcpeek  Date of Exam:   05/24/2024 Medical Rec #: [id number]       Accession #:    [id number] Date of Birth: [birth year]      Patient Gender: M Patient Age:   28 years Exam Location:  [address] Procedure:      VAS US  LOWER EXTREMITY VENOUS (DVT) Referring Phys: RAKESH ALVA --------------------------------------------------------------------------------  Indications: Pain.   Risk Factors: None identified. Comparison Study: None. Performing Technologist: Garnette Rockers  Examination Guidelines: A complete evaluation includes B-mode imaging, spectral Doppler, color Doppler, and power Doppler as needed of all accessible portions of each vessel. Bilateral testing is considered an integral part of a complete examination. Limited examinations for reoccurring indications may be performed as noted. The reflux portion of the exam is performed with the patient in reverse Trendelenburg.  +-----+---------------+---------+-----------+----------+--------------+ RIGHTCompressibilityPhasicitySpontaneityPropertiesThrombus Aging +-----+---------------+---------+-----------+----------+--------------+ CFV  Full           Yes      Yes                                 +-----+---------------+---------+-----------+----------+--------------+   +---------+---------------+---------+-----------+----------+--------------+ LEFT     CompressibilityPhasicitySpontaneityPropertiesThrombus Aging +---------+---------------+---------+-----------+----------+--------------+ CFV      Full  Yes      Yes                                 +---------+---------------+---------+-----------+----------+--------------+ SFJ      Full                                                        +---------+---------------+---------+-----------+----------+--------------+ FV Prox  Full                                                        +---------+---------------+---------+-----------+----------+--------------+ FV Mid   Full                                                        +---------+---------------+---------+-----------+----------+--------------+ FV DistalFull                                                        +---------+---------------+---------+-----------+----------+--------------+ PFV      Full                                                         +---------+---------------+---------+-----------+----------+--------------+ POP      Full           Yes      Yes                                 +---------+---------------+---------+-----------+----------+--------------+ PTV      Full                    Yes                                 +---------+---------------+---------+-----------+----------+--------------+ PERO     Full                    Yes                                 +---------+---------------+---------+-----------+----------+--------------+ Avascular fluid mass seen behind knee measuring 3.51 x 5.12 x 6.18 cm.    Summary: RIGHT: - No evidence of common femoral vein obstruction.   LEFT: - There is no evidence of deep vein thrombosis in the lower extremity.  - A cystic structure is found in the popliteal fossa.  *See table(s) above for measurements and observations. Electronically signed by Maude Emmer MD on 05/24/2024 at 3:48:36 PM.    Final  Recent Labs: Lab Results  Component Value Date   WBC 5.9 03/01/2024   HGB 15.3 03/01/2024   PLT 211 03/01/2024   NA 139 03/01/2024   K 4.3 03/01/2024   CL 103 03/01/2024   CO2 29 03/01/2024   GLUCOSE 116 (H) 03/01/2024   BUN 18 03/01/2024   CREATININE 1.77 (H) 03/01/2024   BILITOT 0.6 03/01/2024   ALKPHOS 121 05/26/2022   AST 21 03/01/2024   ALT 22 03/01/2024   PROT 7.2 03/01/2024   ALBUMIN 4.4 05/26/2022   CALCIUM 10.0 03/01/2024   GFRAA >60 02/13/2017      Speciality Comments: Adalimumab  started 10/14/23 (adalimumab -adaz per insurance) TB Gold September 04, 2023  Procedures:  No procedures performed Allergies: Patient has no known allergies.   Assessment / Plan:     Visit Diagnoses: Sarcoidosis - Scattered pulmonary nodules, mesenteric lymphadenopathy and possible liver lesions.  Patient started Humira  on October 14, 2023.  He has been doing well on Humira .  He has been tolerating Humira  without any side effects.  He states he had been taking Humira  40 mg  subcu every other week without any interruption until last week.  He had to stop Humira  due to infection from dog bite.  He plans to resume Humira  soon after finishing Augmentin. Improvement in pulmonary sarcoidosis was noted on the recent CT chest, abdomen and pelvis.  Findings were reviewed with the patient and his wife. IMPRESSION: 1. Multiple scattered bilateral pulmonary nodules throughout the lungs. Some of these are unchanged compared to prior examination, many other previously seen nodules are diminished in size or completely resolved. Particularly, very extensive, clustered perilymphatic nodularity about the hila is markedly improved compared to prior examination. Findings are consistent with improved pulmonary sarcoidosis. 2. Diffuse bilateral bronchial wall thickening, consistent with nonspecific infectious or inflammatory bronchitis. 3. No imaging evidence of nodal sarcoidosis or other lymphadenopathy in the chest, abdomen, or pelvis.  Electronically Signed   By: Marolyn JONETTA Jaksch M.D.   On: 06/06/2024 06:10  Uveitis - dxd by Dr. Nonah at [facility] retina specialist.  Treated with oral prednisone and prednisolone eyedrops.  He is off oral prednisone now.  Patient states he was recently evaluated by ophthalmologist and had no active disease.  [facility]  High risk medication use - Humira  40 mg subcu every other week, started on October 13, 2023. - June 02, 2024 WBC 8.2, hemoglobin 14.3, platelets 184, CMP creatinine 1.40, GFR 59, AST 16, ALT 17, lipid panel LDL 126, TSH normal.  TB Gold was negative on September 04, 2023.  He was advised to get labs every 3 months.  Information reimmunization was placed in the AVS.  Plan: QuantiFERON-TB Gold Plus in January 2026.  Patient was advised to hold Humira  if he develops an infection resume after the infection resolves.  Annual skin examination to screen for skin cancer was advised.  Use of  sunscreen and sun protection was discussed.  Infection of wound due to dog bite-he was bitten by his dog on his right index finger distal phalanx.  He is on Augmentin currently.  Hypercalcemia-improved.  Stage 3b chronic kidney disease (HCC) - followed by [facility].  His renal functions have improved.  Status post fusion of wrist-bilateral by Dr. Camella - 2020, 2023 and 2024.  Abnormal SPEP - Followed by Dr. Timmy  Obstructive sleep apnea on CPAP  Stenosis of carotid artery, unspecified laterality  PTSD (post-traumatic stress disorder)  Bipolar  1 disorder (HCC)  Orders: Orders Placed This Encounter  Procedures   QuantiFERON-TB Gold Plus   No orders of the defined types were placed in this encounter.  Follow-Up Instructions: Return in about 5 months (around 11/07/2024) for Sarcoidosis.   Maya Nash, MD  Note - This record has been created using Animal nutritionist.  Chart creation errors have been sought, but may not always  have been located. Such creation errors do not reflect on  the standard of medical care.

## 2024-06-03 ENCOUNTER — Ambulatory Visit (HOSPITAL_BASED_OUTPATIENT_CLINIC_OR_DEPARTMENT_OTHER): Admitting: Pulmonary Disease

## 2024-06-03 ENCOUNTER — Ambulatory Visit
Admission: RE | Admit: 2024-06-03 | Discharge: 2024-06-03 | Disposition: A | Discharge status: Home / Self Care | Source: Ambulatory Visit | Attending: Pulmonary Disease | Admitting: Pulmonary Disease

## 2024-06-03 DIAGNOSIS — D869 Sarcoidosis, unspecified: Secondary | ICD-10-CM

## 2024-06-06 ENCOUNTER — Telehealth: Payer: Self-pay | Admitting: Rheumatology

## 2024-06-06 NOTE — Telephone Encounter (Signed)
 Reached out to patient and he states he has the issue resolved.

## 2024-06-06 NOTE — Telephone Encounter (Signed)
 Patient left a message on 10/10. pt stated he has a question about the medication he is on.

## 2024-06-09 ENCOUNTER — Encounter: Payer: Self-pay | Admitting: Rheumatology

## 2024-06-09 ENCOUNTER — Ambulatory Visit: Attending: Rheumatology | Admitting: Rheumatology

## 2024-06-09 VITALS — BP 121/83 | HR 65 | Temp 98.4°F | Resp 14 | Ht 67.0 in | Wt 224.6 lb

## 2024-06-09 DIAGNOSIS — Z79899 Other long term (current) drug therapy: Secondary | ICD-10-CM

## 2024-06-09 DIAGNOSIS — I6529 Occlusion and stenosis of unspecified carotid artery: Secondary | ICD-10-CM

## 2024-06-09 DIAGNOSIS — G4733 Obstructive sleep apnea (adult) (pediatric): Secondary | ICD-10-CM

## 2024-06-09 DIAGNOSIS — T148XXA Other injury of unspecified body region, initial encounter: Secondary | ICD-10-CM | POA: Diagnosis not present

## 2024-06-09 DIAGNOSIS — R778 Other specified abnormalities of plasma proteins: Secondary | ICD-10-CM

## 2024-06-09 DIAGNOSIS — W540XXA Bitten by dog, initial encounter: Secondary | ICD-10-CM

## 2024-06-09 DIAGNOSIS — F319 Bipolar disorder, unspecified: Secondary | ICD-10-CM

## 2024-06-09 DIAGNOSIS — D869 Sarcoidosis, unspecified: Secondary | ICD-10-CM

## 2024-06-09 DIAGNOSIS — L089 Local infection of the skin and subcutaneous tissue, unspecified: Secondary | ICD-10-CM

## 2024-06-09 DIAGNOSIS — F4310 Post-traumatic stress disorder, unspecified: Secondary | ICD-10-CM

## 2024-06-09 DIAGNOSIS — H209 Unspecified iridocyclitis: Secondary | ICD-10-CM | POA: Diagnosis not present

## 2024-06-09 DIAGNOSIS — Z981 Arthrodesis status: Secondary | ICD-10-CM

## 2024-06-09 DIAGNOSIS — N1832 Chronic kidney disease, stage 3b: Secondary | ICD-10-CM

## 2024-06-09 NOTE — Patient Instructions (Signed)
 Standing Labs We placed an order today for your standing lab work.   Please have your standing labs drawn in January and every 3 months  Please have your labs drawn 2 weeks prior to your appointment so that the provider can discuss your lab results at your appointment, if possible.  Please note that you may see your imaging and lab results in MyChart before we have reviewed them. We will contact you once all results are reviewed. Please allow our office up to 72 hours to thoroughly review all of the results before contacting the office for clarification of your results.  WALK-IN LAB HOURS  Monday through Thursday from 8:00 am -12:30 pm and 1:00 pm-4:30 pm and Friday from 8:00 am-12:00 pm.  Patients with office visits requiring labs will be seen before walk-in labs.  You may encounter longer than normal wait times. Please allow additional time. Wait times may be shorter on  Monday and Thursday afternoons.  We do not book appointments for walk-in labs. We appreciate your patience and understanding with our staff.   Labs are drawn by [facility]. Please bring your co-pay at the time of your lab draw.  You may receive a bill from [facility] for your lab work.  Please note if you are on Hydroxychloroquine and and an order has been placed for a Hydroxychloroquine level,  you will need to have it drawn 4 hours or more after your last dose.  If you wish to have your labs drawn at another location, please call the office 24 hours in advance so we can fax the orders.  The office is located at [address], [address], [place], [place] [address]   If you have any questions regarding directions or hours of operation,  please call [phone].   As a reminder, please drink plenty of water prior to coming for your lab work. Thanks!   Vaccines You are taking a medication(s) that can suppress your immune system.  The following immunizations are recommended: Flu annually ( High dose) Covid-19  RSV Td/Tdap  (tetanus, diphtheria, pertussis) every 10 years Pneumonia (Prevnar 15 then Pneumovax 23 at least 1 year apart.  Alternatively, can take Prevnar 20 without needing additional dose) Shingrix: 2 doses from 4 weeks to 6 months apart  Please check with your PCP to make sure you are up to date.   If you have signs or symptoms of an infection or start antibiotics: First, call your PCP for workup of your infection. Hold your medication through the infection, until you complete your antibiotics, and until symptoms resolve if you take the following: Injectable medication (Actemra, Benlysta, Cimzia, Cosentyx, Enbrel, Humira , Kevzara, Orencia, Remicade, Simponi, Stelara, Taltz, Tremfya) Methotrexate Leflunomide (Arava) Mycophenolate (Cellcept) Earma, Olumiant, or Rinvoq   Please get an annual skin examination to screen for skin cancer.  Please use sunscreen and sun protection.

## 2024-08-11 ENCOUNTER — Ambulatory Visit (INDEPENDENT_AMBULATORY_CARE_PROVIDER_SITE_OTHER): Admitting: Pulmonary Disease

## 2024-08-11 VITALS — BP 119/85 | HR 80 | Temp 97.5°F | Ht 67.0 in | Wt 224.3 lb

## 2024-08-11 DIAGNOSIS — G4733 Obstructive sleep apnea (adult) (pediatric): Secondary | ICD-10-CM | POA: Diagnosis not present

## 2024-08-11 DIAGNOSIS — D869 Sarcoidosis, unspecified: Secondary | ICD-10-CM

## 2024-08-11 NOTE — Progress Notes (Signed)
 Subjective:    Patient ID: Casey Yoder, male    DOB: 1966-04-10, 58 y.o.   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 This also explained his previous presentation with AKI and hypercalcemia. He has been started on 40 mg of prednisone      PMH :  admitted 04/2022 with hypercalcemia ranging from 10.6-11.3 and AKI with BUN/creatinine 38/3.7.  He was treated with bisphosphonate Cr decreased 08/05/2023 of 1.8.   He works as an radio broadcast assistant, his spouse Casey Yoder is a respiratory therapist     Discussed the use of AI scribe software for clinical note transcription with the patient, who gave verbal consent to proceed.  History of Present Illness Casey Yoder is a 58 year old male with sarcoidosis and obstructive sleep apnea who presents for a three-month follow-up.  He is treated for sarcoidosis with Humira . He reports an October scan showed decreased nodules and lymph node size. He is concerned about long-term immune effects of Humira . He recalls prior extrapulmonary sarcoidosis and past hypercalcemia, but his calcium is currently normal.  He had a shingles flare 2 weeks ago, diagnosed by his primary care physician from a photo and treated with an antibiotic. Symptoms are improving, with some residual lesions.  For obstructive sleep apnea, he has persistent snoring while using his CPAP, as noted by his wife. He is unsure if the machine or settings are correct and is [facility].     Significant tests/ events reviewed    CT C/A/P 05/2024 multiple nodules, nodules & LNs improved   PFTs 10/2023 normal CT chest /A/P 09/2023 >> Substantial peribronchovascular lymph nodes bilaterally along subpleural nodules in a fairly classic distribution for  pulmonary sarcoidosis, increased compared to 04/2021 09/04/2023 ACE level 113   CT abdomen/pelvis 08/2023 prominent mesenteric lymph nodes, enlarged porta hepatis and prominent retroperitoneal lymph nodes 8 mm subpleural nodule right middle lobe 7 mm nodule right lower lobe, 8 mm left lower lobe subpleural   CT abdomen/pelvis 05/2022 mesenteric lymphadenopathy, 5 mm right middle and lower lobe nodules   CT Cors 04/2021 right lower lobe 4 mm subpleural and right middle lobe 4 mm nodule versus lymph node      Review of Systems  neg for any significant sore throat, dysphagia, itching, sneezing, nasal congestion or excess/ purulent secretions, fever, chills, sweats, unintended wt loss, pleuritic or exertional cp, hempoptysis, orthopnea pnd or change in chronic leg swelling. Also denies presyncope, palpitations, heartburn, abdominal pain, nausea, vomiting, diarrhea or change in bowel or urinary habits, dysuria,hematuria, rash, arthralgias, visual complaints, headache, numbness weakness or ataxia.      Objective:   Physical Exam  Gen. Pleasant, well-nourished, in no distress ENT - no thrush, no pallor/icterus,no post nasal drip Neck: No JVD, no thyromegaly, no carotid bruits Lungs: no use of accessory muscles, no dullness to percussion, clear without rales or rhonchi  Cardiovascular: Rhythm regular, heart sounds  normal, no murmurs or gallops, no peripheral edema Musculoskeletal: No deformities, no cyanosis or clubbing        Assessment & Plan:   Assessment and Plan Assessment & Plan Sarcoidosis, multisystem Multisystem sarcoidosis with favorable response to Humira , evidenced by reduction in nodules and lymph glands on the October scan. Calcium levels are within normal parameters. Prognosis is favorable due to quick response, but unfavorable  due to multisystem involvement. Two-thirds chance of remission over two years, one-third chance of requiring long-term Humira . - Continue Humira  for  one year and reassess. - Avoid crowds and practice good hygiene to prevent infections. - Will consider reducing Humira  if in remission after one year.  Obstructive sleep apnea Issues with CPAP machine, including snoring through the machine. [facility]. - Will obtain [facility]AP data download from [facility] during January appointment. - Will evaluate CPAP settings and consider pressure adjustment if necessary. - Continue Provigil.

## 2024-08-11 NOTE — Patient Instructions (Signed)
°  VISIT SUMMARY: You had a follow-up appointment to review your sarcoidosis and obstructive sleep apnea. Your sarcoidosis is responding well to Humira , and your calcium levels are normal. You also discussed recent issues with your CPAP machine for sleep apnea and a recent shingles flare.  YOUR PLAN: -SARCOIDOSIS: Sarcoidosis is a condition where inflammatory cells grow in different parts of your body. Your recent scan showed improvement, and your calcium levels are normal. You should continue taking Humira  for one year and avoid crowds to prevent infections. We will reassess your condition after one year to see if you can reduce the medication.  -OBSTRUCTIVE SLEEP APNEA: Obstructive sleep apnea is a condition where your breathing stops and starts during sleep. You are experiencing snoring despite using your CPAP machine. [facility] to evaluate your [facility]AP settings and possibly adjust the pressure. Continue taking Provigil as prescribed.  INSTRUCTIONS: Please [facility] in [facility] for your [facility] evaluation. Continue taking Humira  and Provigil as directed. Avoid crowds and practice good hygiene to prevent infections. We will reassess your sarcoidosis treatment in one year.                      Contains text generated by [employer].                                 Contains text generated by [employer].

## 2024-08-19 ENCOUNTER — Encounter: Payer: Self-pay | Admitting: Pharmacist

## 2024-08-19 NOTE — Telephone Encounter (Signed)
 error

## 2024-09-12 ENCOUNTER — Other Ambulatory Visit: Payer: Self-pay | Admitting: Rheumatology

## 2024-09-12 DIAGNOSIS — D869 Sarcoidosis, unspecified: Secondary | ICD-10-CM

## 2024-09-12 DIAGNOSIS — H209 Unspecified iridocyclitis: Secondary | ICD-10-CM

## 2024-09-13 NOTE — Telephone Encounter (Signed)
 Last Fill: 05/24/2024  Labs: 06/02/2024 creatinine 1.40, GFR 59, RBC 4.46, monocytes# 0.90  TB Gold: 09/04/2023    Next Visit: 11/10/2024  Last Visit: 06/09/2024  DX: Sarcoidosis   Current Dose per office note on 06/09/2024: Humira  40 mg subcu every other week.   Advised patient that he is due for labs. Patient verbalized understanding and will update labs this week. Standing lab orders are in place.   Okay to refill Hyrimoz ?

## 2024-11-10 ENCOUNTER — Ambulatory Visit: Admitting: Rheumatology

## 2025-02-09 ENCOUNTER — Ambulatory Visit (HOSPITAL_BASED_OUTPATIENT_CLINIC_OR_DEPARTMENT_OTHER): Admitting: Pulmonary Disease
